# Patient Record
Sex: Female | Born: 1945 | Race: Black or African American | Hispanic: No | Smoking: Former smoker
Health system: Southern US, Community
[De-identification: ages and names within clinical notes are randomized; demographics above are authoritative.]

## PROBLEM LIST (undated history)

## (undated) DIAGNOSIS — E079 Disorder of thyroid, unspecified: Secondary | ICD-10-CM

## (undated) DIAGNOSIS — I671 Cerebral aneurysm, nonruptured: Secondary | ICD-10-CM

## (undated) DIAGNOSIS — I10 Essential (primary) hypertension: Secondary | ICD-10-CM

## (undated) HISTORY — PX: BRAIN SURGERY: SHX531

## (undated) HISTORY — PX: OTHER SURGICAL HISTORY: SHX169

---

## 1984-08-07 DIAGNOSIS — Z862 Personal history of diseases of the blood and blood-forming organs and certain disorders involving the immune mechanism: Secondary | ICD-10-CM | POA: Insufficient documentation

## 2013-07-08 DIAGNOSIS — R4189 Other symptoms and signs involving cognitive functions and awareness: Secondary | ICD-10-CM | POA: Insufficient documentation

## 2013-07-08 DIAGNOSIS — E039 Hypothyroidism, unspecified: Secondary | ICD-10-CM | POA: Insufficient documentation

## 2013-07-08 DIAGNOSIS — I10 Essential (primary) hypertension: Secondary | ICD-10-CM | POA: Insufficient documentation

## 2013-07-08 DIAGNOSIS — I69319 Unspecified symptoms and signs involving cognitive functions following cerebral infarction: Secondary | ICD-10-CM | POA: Insufficient documentation

## 2016-02-18 DIAGNOSIS — M1990 Unspecified osteoarthritis, unspecified site: Secondary | ICD-10-CM | POA: Insufficient documentation

## 2016-02-18 DIAGNOSIS — M810 Age-related osteoporosis without current pathological fracture: Secondary | ICD-10-CM | POA: Insufficient documentation

## 2016-12-06 DIAGNOSIS — E611 Iron deficiency: Secondary | ICD-10-CM | POA: Insufficient documentation

## 2017-01-12 DIAGNOSIS — F339 Major depressive disorder, recurrent, unspecified: Secondary | ICD-10-CM | POA: Insufficient documentation

## 2017-04-14 DIAGNOSIS — I422 Other hypertrophic cardiomyopathy: Secondary | ICD-10-CM | POA: Insufficient documentation

## 2018-01-18 DIAGNOSIS — H04123 Dry eye syndrome of bilateral lacrimal glands: Secondary | ICD-10-CM | POA: Insufficient documentation

## 2018-01-18 DIAGNOSIS — H2513 Age-related nuclear cataract, bilateral: Secondary | ICD-10-CM | POA: Insufficient documentation

## 2018-07-07 ENCOUNTER — Inpatient Hospital Stay
Admission: EM | Admit: 2018-07-07 | Discharge: 2018-07-10 | DRG: 516 | Disposition: A | Discharge status: Home Health | Payer: Medicare Other | Attending: Internal Medicine | Admitting: Internal Medicine

## 2018-07-07 ENCOUNTER — Encounter: Payer: Self-pay | Admitting: Emergency Medicine

## 2018-07-07 ENCOUNTER — Other Ambulatory Visit: Payer: Self-pay

## 2018-07-07 ENCOUNTER — Emergency Department: Payer: Medicare Other

## 2018-07-07 DIAGNOSIS — Z886 Allergy status to analgesic agent status: Secondary | ICD-10-CM

## 2018-07-07 DIAGNOSIS — J432 Centrilobular emphysema: Secondary | ICD-10-CM | POA: Diagnosis present

## 2018-07-07 DIAGNOSIS — R7989 Other specified abnormal findings of blood chemistry: Secondary | ICD-10-CM | POA: Diagnosis present

## 2018-07-07 DIAGNOSIS — W010XXA Fall on same level from slipping, tripping and stumbling without subsequent striking against object, initial encounter: Secondary | ICD-10-CM | POA: Diagnosis present

## 2018-07-07 DIAGNOSIS — Z7983 Long term (current) use of bisphosphonates: Secondary | ICD-10-CM | POA: Diagnosis not present

## 2018-07-07 DIAGNOSIS — S22070A Wedge compression fracture of T9-T10 vertebra, initial encounter for closed fracture: Secondary | ICD-10-CM | POA: Diagnosis present

## 2018-07-07 DIAGNOSIS — Z8619 Personal history of other infectious and parasitic diseases: Secondary | ICD-10-CM

## 2018-07-07 DIAGNOSIS — Z419 Encounter for procedure for purposes other than remedying health state, unspecified: Secondary | ICD-10-CM

## 2018-07-07 DIAGNOSIS — I248 Other forms of acute ischemic heart disease: Secondary | ICD-10-CM | POA: Diagnosis present

## 2018-07-07 DIAGNOSIS — I119 Hypertensive heart disease without heart failure: Secondary | ICD-10-CM | POA: Diagnosis present

## 2018-07-07 DIAGNOSIS — Z79899 Other long term (current) drug therapy: Secondary | ICD-10-CM

## 2018-07-07 DIAGNOSIS — Z7982 Long term (current) use of aspirin: Secondary | ICD-10-CM

## 2018-07-07 DIAGNOSIS — Z8679 Personal history of other diseases of the circulatory system: Secondary | ICD-10-CM

## 2018-07-07 DIAGNOSIS — R778 Other specified abnormalities of plasma proteins: Secondary | ICD-10-CM

## 2018-07-07 DIAGNOSIS — E079 Disorder of thyroid, unspecified: Secondary | ICD-10-CM | POA: Diagnosis present

## 2018-07-07 DIAGNOSIS — Y92008 Other place in unspecified non-institutional (private) residence as the place of occurrence of the external cause: Secondary | ICD-10-CM

## 2018-07-07 DIAGNOSIS — F17200 Nicotine dependence, unspecified, uncomplicated: Secondary | ICD-10-CM | POA: Diagnosis present

## 2018-07-07 DIAGNOSIS — J441 Chronic obstructive pulmonary disease with (acute) exacerbation: Secondary | ICD-10-CM

## 2018-07-07 DIAGNOSIS — Z79890 Hormone replacement therapy (postmenopausal): Secondary | ICD-10-CM | POA: Diagnosis not present

## 2018-07-07 DIAGNOSIS — I493 Ventricular premature depolarization: Secondary | ICD-10-CM | POA: Diagnosis present

## 2018-07-07 DIAGNOSIS — S22000A Wedge compression fracture of unspecified thoracic vertebra, initial encounter for closed fracture: Secondary | ICD-10-CM | POA: Diagnosis present

## 2018-07-07 DIAGNOSIS — W19XXXA Unspecified fall, initial encounter: Secondary | ICD-10-CM

## 2018-07-07 HISTORY — DX: Cerebral aneurysm, nonruptured: I67.1

## 2018-07-07 HISTORY — DX: Disorder of thyroid, unspecified: E07.9

## 2018-07-07 HISTORY — DX: Essential (primary) hypertension: I10

## 2018-07-07 LAB — CBC
HCT: 45.7 % (ref 36.0–46.0)
Hemoglobin: 15.3 g/dL — ABNORMAL HIGH (ref 12.0–15.0)
MCH: 33.2 pg (ref 26.0–34.0)
MCHC: 33.5 g/dL (ref 30.0–36.0)
MCV: 99.1 fL (ref 80.0–100.0)
PLATELETS: 241 10*3/uL (ref 150–400)
RBC: 4.61 MIL/uL (ref 3.87–5.11)
RDW: 13.8 % (ref 11.5–15.5)
WBC: 10.3 10*3/uL (ref 4.0–10.5)
nRBC: 0 % (ref 0.0–0.2)

## 2018-07-07 LAB — COMPREHENSIVE METABOLIC PANEL
ALT: 11 U/L (ref 0–44)
AST: 24 U/L (ref 15–41)
Albumin: 3.8 g/dL (ref 3.5–5.0)
Alkaline Phosphatase: 45 U/L (ref 38–126)
Anion gap: 14 (ref 5–15)
BUN: 12 mg/dL (ref 8–23)
CHLORIDE: 104 mmol/L (ref 98–111)
CO2: 22 mmol/L (ref 22–32)
Calcium: 9 mg/dL (ref 8.9–10.3)
Creatinine, Ser: 0.63 mg/dL (ref 0.44–1.00)
GFR calc Af Amer: >60 mL/min (ref >60)
GFR calc non Af Amer: >60 mL/min (ref >60)
Glucose, Bld: 145 mg/dL — ABNORMAL HIGH (ref 70–99)
Potassium: 3.5 mmol/L (ref 3.5–5.1)
Sodium: 140 mmol/L (ref 135–145)
Total Bilirubin: 1.1 mg/dL (ref 0.3–1.2)
Total Protein: 8.5 g/dL — ABNORMAL HIGH (ref 6.5–8.1)

## 2018-07-07 LAB — TROPONIN I
Troponin I: 0.13 ng/mL (ref <0.03)
Troponin I: 0.1 ng/mL (ref <0.03)
Troponin I: 0.1 ng/mL (ref <0.03)

## 2018-07-07 MED ORDER — METHYLPREDNISOLONE SODIUM SUCC 125 MG IJ SOLR
125.0000 mg | Freq: Once | INTRAMUSCULAR | Status: AC
  Administered 2018-07-07: 125 mg via INTRAVENOUS
  Filled 2018-07-07: qty 2

## 2018-07-07 MED ORDER — SENNA 8.6 MG PO TABS
1.0000 | ORAL_TABLET | Freq: Two times a day (BID) | ORAL | Status: DC
  Administered 2018-07-07 – 2018-07-10 (×5): 8.6 mg via ORAL
  Filled 2018-07-07 (×5): qty 1

## 2018-07-07 MED ORDER — ACETAMINOPHEN 650 MG RE SUPP: 650.0000 mg | Freq: Four times a day (QID) | RECTAL | Status: DC | PRN

## 2018-07-07 MED ORDER — POLYETHYLENE GLYCOL 3350 17 G PO PACK
17.0000 g | PACK | Freq: Every day | ORAL | Status: DC | PRN
  Administered 2018-07-10: 17 g via ORAL
  Filled 2018-07-07 (×2): qty 1

## 2018-07-07 MED ORDER — MORPHINE SULFATE (PF) 4 MG/ML IV SOLN
4.0000 mg | Freq: Once | INTRAVENOUS | Status: AC
  Administered 2018-07-07: 4 mg via INTRAVENOUS
  Filled 2018-07-07: qty 1

## 2018-07-07 MED ORDER — ONDANSETRON HCL 4 MG/2ML IJ SOLN: 4.0000 mg | Freq: Four times a day (QID) | INTRAMUSCULAR | Status: DC | PRN

## 2018-07-07 MED ORDER — OXYCODONE HCL 5 MG PO TABS
5.0000 mg | ORAL_TABLET | ORAL | Status: DC | PRN
  Administered 2018-07-07 – 2018-07-10 (×7): 5 mg via ORAL
  Filled 2018-07-07 (×7): qty 1

## 2018-07-07 MED ORDER — ONDANSETRON HCL 4 MG/2ML IJ SOLN
4.0000 mg | Freq: Once | INTRAMUSCULAR | Status: AC
  Administered 2018-07-07: 4 mg via INTRAVENOUS
  Filled 2018-07-07: qty 2

## 2018-07-07 MED ORDER — ONDANSETRON HCL 4 MG PO TABS: 4.0000 mg | ORAL_TABLET | Freq: Four times a day (QID) | ORAL | Status: DC | PRN

## 2018-07-07 MED ORDER — VITAMIN D3 25 MCG (1000 UNIT) PO TABS
1000.0000 [IU] | ORAL_TABLET | Freq: Every day | ORAL | Status: DC
  Administered 2018-07-07 – 2018-07-10 (×4): 1000 [IU] via ORAL
  Filled 2018-07-07 (×7): qty 1

## 2018-07-07 MED ORDER — ALBUTEROL SULFATE (2.5 MG/3ML) 0.083% IN NEBU: 2.5000 mg | INHALATION_SOLUTION | RESPIRATORY_TRACT | Status: DC | PRN

## 2018-07-07 MED ORDER — ORAL CARE MOUTH RINSE
15.0000 mL | Freq: Two times a day (BID) | OROMUCOSAL | Status: DC
  Administered 2018-07-07 – 2018-07-09 (×3): 15 mL via OROMUCOSAL

## 2018-07-07 MED ORDER — METOPROLOL SUCCINATE ER 50 MG PO TB24
100.0000 mg | ORAL_TABLET | Freq: Every day | ORAL | Status: DC
  Administered 2018-07-07 – 2018-07-10 (×4): 100 mg via ORAL
  Filled 2018-07-07 (×4): qty 2

## 2018-07-07 MED ORDER — ACETAMINOPHEN 325 MG PO TABS
650.0000 mg | ORAL_TABLET | Freq: Four times a day (QID) | ORAL | Status: DC | PRN
  Administered 2018-07-08 – 2018-07-10 (×3): 650 mg via ORAL
  Filled 2018-07-07 (×3): qty 2

## 2018-07-07 MED ORDER — IPRATROPIUM-ALBUTEROL 0.5-2.5 (3) MG/3ML IN SOLN
3.0000 mL | Freq: Once | RESPIRATORY_TRACT | Status: AC
  Administered 2018-07-07: 3 mL via RESPIRATORY_TRACT
  Filled 2018-07-07: qty 3

## 2018-07-07 MED ORDER — LISINOPRIL 20 MG PO TABS
40.0000 mg | ORAL_TABLET | Freq: Every day | ORAL | Status: DC
  Administered 2018-07-07 – 2018-07-10 (×4): 40 mg via ORAL
  Filled 2018-07-07 (×4): qty 2

## 2018-07-07 MED ORDER — ASPIRIN EC 81 MG PO TBEC
81.0000 mg | DELAYED_RELEASE_TABLET | Freq: Every day | ORAL | Status: DC
  Administered 2018-07-07 – 2018-07-10 (×3): 81 mg via ORAL
  Filled 2018-07-07 (×3): qty 1

## 2018-07-07 MED ORDER — LEVOTHYROXINE SODIUM 50 MCG PO TABS
75.0000 ug | ORAL_TABLET | Freq: Every day | ORAL | Status: DC
  Administered 2018-07-08 – 2018-07-10 (×3): 75 ug via ORAL
  Filled 2018-07-07 (×3): qty 1

## 2018-07-07 MED ORDER — PANTOPRAZOLE SODIUM 20 MG PO TBEC
20.0000 mg | DELAYED_RELEASE_TABLET | Freq: Every day | ORAL | Status: DC
  Administered 2018-07-07 – 2018-07-10 (×4): 20 mg via ORAL
  Filled 2018-07-07 (×4): qty 1

## 2018-07-07 NOTE — ED Notes (Signed)
ED TO INPATIENT HANDOFF REPORT  Name/Age/Gender Emma Mejia 72 y.o. female  Code Status   Home/SNF/Other Home  Chief Complaint fall,back pain  Level of Care/Admitting Diagnosis ED Disposition    ED Disposition Condition Comment   Admit  Hospital Area: [facility] [[id number]]  Level of Care: Med-Surg [16]  Diagnosis: Compression fracture of body of thoracic vertebra [facility]) [[id number]]  Admitting Physician: Joselyn Glassman  Attending Physician: Joselyn Glassman  Estimated length of stay: past midnight tomorrow  Certification:: I certify this patient will need inpatient services for at least 2 midnights  PT Class (Do Not Modify): Inpatient [101]  PT Acc Code (Do Not Modify): Private [1]       Medical History Past Medical History:  Diagnosis Date  . Brain aneurysm   . Hypertension   . Thyroid disease     Allergies Allergies  Allergen Reactions  . Ibuprofen   . Naproxen     IV Location/Drains/Wounds Patient Lines/Drains/Airways Status   Active Line/Drains/Airways    Name:   Placement date:   Placement time:   Site:   Days:   Peripheral IV 07/07/18 Left Antecubital   07/07/18    1026    Antecubital   less than 1          Labs/Imaging Results for orders placed or performed during the hospital encounter of 07/07/18 (from the past 48 hour(s))  CBC     Status: Abnormal   Collection Time: 07/07/18 10:23 AM  Result Value Ref Range   WBC 10.3 4.0 - 10.5 K/uL   RBC 4.61 3.87 - 5.11 MIL/uL   Hemoglobin 15.3 (H) 12.0 - 15.0 g/dL   HCT 45.4 09.8 - 11.9 %   MCV 99.1 80.0 - 100.0 fL   MCH 33.2 26.0 - 34.0 pg   MCHC 33.5 30.0 - 36.0 g/dL   RDW 14.7 82.9 - 56.2 %   Platelets 241 150 - 400 K/uL   nRBC 0.0 0.0 - 0.2 %    Comment: Performed at [facility], [address] [place], [place] [address]  Comprehensive metabolic panel     Status: Abnormal   Collection Time: 07/07/18 10:23 AM  Result Value Ref Range   Sodium 140 135 -  145 mmol/L   Potassium 3.5 3.5 - 5.1 mmol/L   Chloride 104 98 - 111 mmol/L   CO2 22 22 - 32 mmol/L   Glucose, Bld 145 (H) 70 - 99 mg/dL   BUN 12 8 - 23 mg/dL   Creatinine, Ser 5.78 0.44 - 1.00 mg/dL   Calcium 9.0 8.9 - 46.9 mg/dL   Total Protein 8.5 (H) 6.5 - 8.1 g/dL   Albumin 3.8 3.5 - 5.0 g/dL   AST 24 15 - 41 U/L   ALT 11 0 - 44 U/L   Alkaline Phosphatase 45 38 - 126 U/L   Total Bilirubin 1.1 0.3 - 1.2 mg/dL   GFR calc non Af Amer >60 >60 mL/min   GFR calc Af Amer >60 >60 mL/min   Anion gap 14 5 - 15    Comment: Performed at [facility], [address] [place], [place] [address]  Troponin I - ONCE - STAT     Status: Abnormal   Collection Time: 07/07/18 10:23 AM  Result Value Ref Range   Troponin I 0.13 (HH) <0.03 ng/mL    Comment: CRITICAL RESULT CALLED TO, READ BACK BY AND VERIFIED WITH Kamar Callender ON 07/07/18 AT 1115 QSD Performed at  [facility], [address] [place], [place] [address]    Ct Chest Wo Contrast  Result Date: 07/07/2018 CLINICAL DATA:  Fall from porch this morning, hypoxia, back pain EXAM: CT CHEST WITHOUT CONTRAST TECHNIQUE: Multidetector CT imaging of the chest was performed following the standard protocol without IV contrast. COMPARISON:  None. FINDINGS: Cardiovascular: Mild cardiomegaly. No significant pericardial fluid/thickening. Three-vessel coronary atherosclerosis. Atherosclerotic nonaneurysmal thoracic aorta. No acute intramural hematoma in the thoracic aorta. Dilated main pulmonary artery (3.5 cm diameter). Mediastinum/Nodes: No pneumomediastinum. No mediastinal hematoma. No discrete thyroid nodules. Unremarkable esophagus. No axillary adenopathy. Mildly enlarged 1.0 cm right paratracheal node (series 2/image 53). No discrete hilar adenopathy on this noncontrast scan. Lungs/Pleura: No pneumothorax. No pleural effusion. Severe centrilobular emphysema with mild diffuse bronchial wall thickening. Patchy reticulation in the  dependent lung bases bilaterally, which could represent hypoventilatory change or nonspecific scarring. No acute consolidative airspace disease, lung masses or significant pulmonary nodules. Upper abdomen: Cholelithiasis. 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 Musculoskeletal: No aggressive appearing focal osseous lesions. Late subacute healing nondisplaced lateral left fourth through sixth rib fractures. Moderate to severe T10 vertebral compression fracture of uncertain chronicity, with 6 mm bony retropulsion narrowing the anterior thoracic canal. Moderate thoracic spondylosis. IMPRESSION: 1. Moderate to severe T10 vertebral compression fracture of uncertain chronicity, potentially acute, with 6 mm bony retropulsion narrowing the anterior thoracic canal. Thoracic spine MRI could be obtained if there is clinical concern for cord compression. 2. Severe centrilobular emphysema with mild diffuse bronchial wall thickening, suggesting COPD. Patchy bibasilar reticulation, which could represent atelectasis or nonspecific scarring. 3. Cardiomegaly.  Three-vessel coronary atherosclerosis. 4. Dilated main pulmonary artery, suggesting pulmonary arterial hypertension. 5. Nonspecific mild right paratracheal adenopathy. No follow-up required. This recommendation follows ACR consensus guidelines: Managing Incidental Findings on Thoracic CT: Mediastinal and Cardiovascular Findings. A White Paper of the ACR Incidental Findings Committee. J Am Coll Radiol. 2018; 15: 2956-2130. 6. Cholelithiasis. Aortic Atherosclerosis (ICD10-I70.0) and Emphysema (ICD10-J43.9). Electronically Signed   By: Delbert Phenix M.D.   On: 07/07/2018 11:48   Ct Lumbar Spine Wo Contrast  Result Date: 07/07/2018 CLINICAL DATA:  Back pain after a fall today.  Initial encounter. EXAM: CT LUMBAR SPINE WITHOUT CONTRAST TECHNIQUE: Multidetector CT imaging of the lumbar spine was performed without intravenous contrast administration. Multiplanar CT  image reconstructions were also generated. COMPARISON:  None. FINDINGS: Segmentation: Transitional lumbosacral anatomy with partial lumbarization of S1. Alignment: Normal. Vertebrae: L4 and L5 compression fractures with mild vertebral body height loss, no retropulsion, no discretely visible fracture line, and no significant paravertebral edema. No suspicious osseous lesion. Paraspinal and other soft tissues: Extensive aortic atherosclerosis without aneurysm. Gallstones. Status post splenectomy. Disc levels: Preserved disc space heights throughout the lumbar spine. Disc bulging results in mild to moderate multilevel neural foraminal stenosis, greatest at L5-S1. No evidence of significant spinal stenosis. Mild multilevel facet hypertrophy. IMPRESSION: 1. Mild L4 and L5 compression fractures, technically age indeterminate though favored to be chronic. Consider lumbar spine MRI if there is clinical concern for an acute fracture. 2. Mild lumbar spondylosis with mild-to-moderate neural foraminal stenosis. No spinal stenosis. 3.  Aortic Atherosclerosis (ICD10-I70.0). Electronically Signed   By: Sebastian Ache M.D.   On: 07/07/2018 11:12    Pending Labs [employer] (From admission, onward)    Start     Ordered   Signed and Held  CBC  (enoxaparin (LOVENOX)    CrCl >/= 30 ml/min)  Once,   R    Comments:  Baseline  for enoxaparin therapy IF NOT ALREADY DRAWN.  Notify MD if PLT < 100 K.    Signed and Held   Signed and Held  Creatinine, serum  (enoxaparin (LOVENOX)    CrCl >/= 30 ml/min)  Once,   R    Comments:  Baseline for enoxaparin therapy IF NOT ALREADY DRAWN.    Signed and Held   Signed and Held  Creatinine, serum  (enoxaparin (LOVENOX)    CrCl >/= 30 ml/min)  Weekly,   R    Comments:  while on enoxaparin therapy    Signed and Held   Signed and Held  Troponin I - Now Then Q6H  Now then every 6 hours,   R     Signed and Held          Vitals/Pain Today's Vitals   07/07/18 1102 07/07/18 1200  07/07/18 1230 07/07/18 1300  BP:  (!) 141/82 (!) 163/77 (!) 158/70  Pulse:  83 86 90  Resp:  15 20 15   SpO2:  95% 94% 97%  Weight:      Height:      PainSc: 0-No pain       Isolation Precautions No active isolations  Medications Medications  morphine 4 MG/ML injection 4 mg (4 mg Intravenous Given 07/07/18 1030)  ondansetron (ZOFRAN) injection 4 mg (4 mg Intravenous Given 07/07/18 1030)  methylPREDNISolone sodium succinate (SOLU-MEDROL) 125 mg/2 mL injection 125 mg (125 mg Intravenous Given 07/07/18 1242)  ipratropium-albuterol (DUONEB) 0.5-2.5 (3) MG/3ML nebulizer solution 3 mL (3 mLs Nebulization Given 07/07/18 1243)  ipratropium-albuterol (DUONEB) 0.5-2.5 (3) MG/3ML nebulizer solution 3 mL (3 mLs Nebulization Given 07/07/18 1242)    Mobility walks with person assist

## 2018-07-07 NOTE — ED Notes (Signed)
Patient transported to CT 

## 2018-07-07 NOTE — ED Provider Notes (Signed)
[facility] Emergency Department Provider Note  Time seen: 10:25 AM  I have reviewed the triage vital signs and the nursing notes.   HISTORY  Chief Complaint Fall    HPI Emma Mejia is a 72 y.o. female with a past medical history of hypertension, past brain aneurysm, presents to the emergency department for back pain after a fall.  According to the patient around 7:00 this morning she slipped on her front porch falling on her buttocks and back.  Patient states initially she was able to get up and walk however over the last 2 to 3 hours the pain has worsened substantially and is now experiencing significant pain mostly in her mid to upper back.  Denies any chest pain.  Denies any trouble breathing.  Largely negative review of systems otherwise.   Past Medical History:  Diagnosis Date  . Brain aneurysm   . Hypertension   . Thyroid disease     There are no active problems to display for this patient.   Past Surgical History:  Procedure Laterality Date  . BRAIN SURGERY    . spleen      Prior to Admission medications   Not on File    Allergies  Allergen Reactions  . Ibuprofen   . Naproxen     No family history on file.  Social History Social History   Tobacco Use  . Smoking status: Current Every Day Smoker  . Smokeless tobacco: Never Used  Substance Use Topics  . Alcohol use: Not Currently  . Drug use: Not Currently    Review of Systems Constitutional: Negative for fever Cardiovascular: Negative for chest pain. Respiratory: Negative for shortness of breath. Gastrointestinal: Negative for abdominal pain Musculoskeletal: Patient with mid to upper back pain. Skin: Negative for skin complaints  Neurological: Negative for headache All other ROS negative  ____________________________________________   PHYSICAL EXAM:  VITAL SIGNS: ED Triage Vitals  Enc Vitals Group     BP 07/07/18 1000 (!) 193/102     Pulse Rate 07/07/18 1000  (!) 113     Resp 07/07/18 1000 18     Temp --      Temp src --      SpO2 07/07/18 1000 (!) 88 %     Weight 07/07/18 1002 120 lb (54.4 kg)     Height 07/07/18 1002 5\' 3"  (1.6 m)     Head Circumference --      Peak Flow --      Pain Score --      Pain Loc --      Pain Edu? --      Excl. in GC? --    Constitutional: Alert and oriented. Well appearing and in no distress. Eyes: Normal exam ENT   Head: Normocephalic and atraumatic.   Mouth/Throat: Mucous membranes are moist. Cardiovascular: Normal rate, regular rhythm. No murmur Respiratory: Normal respiratory effort without tachypnea nor retractions. Breath sounds are clear  Gastrointestinal: Soft and nontender. No distention.  Musculoskeletal: Patient has mild to moderate left-sided paraspinal tenderness to palpation in the mid thoracic spine.  No lumbar midline tenderness to palpation. Neurologic:  Normal speech and language. No gross focal neurologic deficits  Skin:  Skin is warm, dry and intact.  Psychiatric: Mood and affect are normal.  ____________________________________________    EKG  EKG reviewed and interpreted by myself shows a normal sinus rhythm 88 bpm with a narrow QRS, normal axis, largely normal intervals.  Patient does have lateral T wave inversions  with occasional PVC.  No old EKG for comparison.  ____________________________________________    RADIOLOGY  CT scan shows a moderate to severe T10 compression fracture there is 6 mm of retropulsion.  Patient also appears to have fairly severe COPD.  CT lumbar spine shows possible L4-L5 compression fractions although favored to be chronic.  ____________________________________________   INITIAL IMPRESSION / ASSESSMENT AND PLAN / ED COURSE  Pertinent labs & imaging results that were available during my care of the patient were reviewed by me and considered in my medical decision making (see chart for details).  Patient presents to the emergency department  with midthoracic back pain since a fall this morning.  Patient has been ambulatory since the fall.  States the pain has progressively worsened over the past 2 to 3 hours.  Upon arrival patient is noted to be mildly hypoxic 88% on room air placed on 2 L nasal cannula and is satting in the low to mid 90s.  Denies any home O2 use.  Patient has moderate mid thoracic tenderness in the paraspinal region no midline tenderness.  We will obtain CT imaging of the chest and lumbar spine to further evaluate.  We will treat pain, check labs and an EKG.  Patient CT show a T10 compression fracture with 6 mm of retropulsion.  Normal neurological examination.  Patient remains hypoxic in the upper 80s on room air with no home O2 requirement.  CT scan of the chest appears most consistent with COPD patient has never been diagnosed with COPD but is a daily smoker and has been smoking for many many years per patient.  We will treat with DuoNeb's and Solu-Medrol.  Patient's troponin is elevated 0.13 with no history in our system of elevated troponins in the past.  Normal kidney function.  We will cover with aspirin, patient will require ongoing monitoring.  Given the patient's back pain and compression fracture we will admit to the hospitalist service for ongoing treatment of her pain as well as COPD.  Patient agreeable to plan of care.  ____________________________________________   FINAL CLINICAL IMPRESSION(S) / ED DIAGNOSES  Back pain Compression fracture COPD Elevated troponin   Minna AntisPaduchowski, Jerrika Ledlow, MD 07/07/18 1233

## 2018-07-07 NOTE — ED Notes (Signed)
Pt ambulatory to toilet with 1 assist.  

## 2018-07-07 NOTE — ED Notes (Signed)
Attempted to call report, on hold for greater than 10 mins.  Called back and told charge RN has been busy and has not looked at pt.  Per Charge RN will take pt to floor and give bedside report.

## 2018-07-07 NOTE — ED Notes (Signed)
Date and time results received: 07/07/18 11:15 AM    Test: Troponin Critical Value: 0.13  Name of Provider Notified: Paduchowski

## 2018-07-07 NOTE — ED Triage Notes (Signed)
Pt to ED via POV with son who states that pt fell this morning on the porch. Upon triage pt O2 sat in the 80's. Pt brought to room 3 and placed on O2. Pt c/o pain in her back.

## 2018-07-07 NOTE — Plan of Care (Signed)

## 2018-07-07 NOTE — H&P (Addendum)
[facility] - [place] at [facility]   PATIENT NAME: Emma Mejia    MR#:  [id number]  DATE OF BIRTH:  1946-06-14  DATE OF ADMISSION:  07/07/2018  PRIMARY CARE PHYSICIAN: Bertram Savin, MD   REQUESTING/REFERRING PHYSICIAN: dr Thomasene Mohair  CHIEF COMPLAINT:   Patient reports falling on the porch this morning landed on her buttocks. Complaining of back pain. HISTORY OF PRESENT ILLNESS:  Emma Mejia  is a 72 y.o. female with a known history of ongoing tobacco abuse, hypertension comes to the emergency room after she landed on her buttock while being on the porch this morning. Patient said she was awake alert she lost her balance started having back pain came to the emergency room was found to have T10 compression fracture.  Patient has history of smoking. She was found to be hypoxic sats were 8788 on room air. She is currently not having shortness of breath or chest pain. She does not have any cardiac history with CAD or MI in the past according to the daughter.  Troponin was .13. When patient came to the emergency room her blood pressure was 198/100.  Patient is being admitted for further evaluation of management. Daughter and son in the room.  PAST MEDICAL HISTORY:   Past Medical History:  Diagnosis Date  . Brain aneurysm   . Hypertension   . Thyroid disease     PAST SURGICAL HISTOIRY:   Past Surgical History:  Procedure Laterality Date  . BRAIN SURGERY    . spleen      SOCIAL HISTORY:   Social History   Tobacco Use  . Smoking status: Current Every Day Smoker  . Smokeless tobacco: Never Used  Substance Use Topics  . Alcohol use: Not Currently    FAMILY HISTORY:  No family history on file.  DRUG ALLERGIES:   Allergies  Allergen Reactions  . Ibuprofen   . Naproxen     REVIEW OF SYSTEMS:  Review of Systems  Constitutional: Negative for chills, fever and weight loss.  HENT: Negative for ear discharge, ear pain and  nosebleeds.   Eyes: Negative for blurred vision, pain and discharge.  Respiratory: Negative for sputum production, shortness of breath, wheezing and stridor.   Cardiovascular: Negative for chest pain, palpitations, orthopnea and PND.  Gastrointestinal: Negative for abdominal pain, diarrhea, nausea and vomiting.  Genitourinary: Negative for frequency and urgency.  Musculoskeletal: Positive for back pain. Negative for joint pain.  Neurological: Negative for sensory change, speech change, focal weakness and weakness.  Psychiatric/Behavioral: Negative for depression and hallucinations. The patient is not nervous/anxious.      MEDICATIONS AT HOME:   Prior to Admission medications   Medication Sig Start Date End Date Taking? Authorizing Provider  alendronate (FOSAMAX) 70 MG tablet Take 70 mg by mouth once a week. Take with a full glass of water on an empty stomach.   Yes [provider]  aspirin EC 81 MG tablet Take 81 mg by mouth daily.   Yes [provider]  cholecalciferol (VITAMIN D) 25 MCG (1000 UT) tablet Take 1,000 Units by mouth daily.   Yes [provider]  levothyroxine (SYNTHROID, LEVOTHROID) 75 MCG tablet Take 75 mcg by mouth daily before breakfast.   Yes [provider]  lisinopril (PRINIVIL,ZESTRIL) 20 MG tablet Take 40 mg by mouth daily.   Yes [provider]  metoprolol succinate (TOPROL-XL) 100 MG 24 hr tablet Take 100 mg by mouth daily.   Yes [provider]  pantoprazole (PROTONIX) 20 MG tablet Take 20 mg by mouth daily.   Yes [provider]      VITAL SIGNS:  Blood pressure (!) 141/82, pulse 83, resp. rate 15, height 5\' 3"  (1.6 m), weight 54.4 kg, SpO2 95 %.  PHYSICAL EXAMINATION:  GENERAL:  72 y.o.-year-old patient lying in the bed with no acute distress.thin fraile  EYES: Pupils equal, round, reactive to light and accommodation. No scleral icterus. Extraocular muscles intact.  HEENT: Head atraumatic,  normocephalic. Oropharynx and nasopharynx clear.  NECK:  Supple, no jugular venous distention. No thyroid enlargement, no tenderness.  LUNGS: distant breath sounds bilaterally, no wheezing, rales,rhonchi or crepitation. No use of accessory muscles of respiration.  CARDIOVASCULAR: S1, S2 normal. No murmurs, rubs, or gallops.  ABDOMEN: Soft, nontender, nondistended. Bowel sounds present. No organomegaly or mass.  EXTREMITIES: No pedal edema, cyanosis, or clubbing.  NEUROLOGIC: Cranial nerves II through XII are intact. Muscle strength 5/5 in all extremities. Sensation intact. Gait not checked.  PSYCHIATRIC: The patient is alert and oriented x 3.  SKIN: No obvious rash, lesion, or ulcer.   LABORATORY PANEL:   CBC Recent Labs  Lab 07/07/18 1023  WBC 10.3  HGB 15.3*  HCT 45.7  PLT 241   ------------------------------------------------------------------------------------------------------------------  Chemistries  Recent Labs  Lab 07/07/18 1023  NA 140  K 3.5  CL 104  CO2 22  GLUCOSE 145*  BUN 12  CREATININE 0.63  CALCIUM 9.0  AST 24  ALT 11  ALKPHOS 45  BILITOT 1.1   ------------------------------------------------------------------------------------------------------------------  Cardiac Enzymes Recent Labs  Lab 07/07/18 1023  TROPONINI 0.13*   ------------------------------------------------------------------------------------------------------------------  RADIOLOGY:  Ct Chest Wo Contrast  Result Date: 07/07/2018 CLINICAL DATA:  Fall from porch this morning, hypoxia, back pain EXAM: CT CHEST WITHOUT CONTRAST TECHNIQUE: Multidetector CT imaging of the chest was performed following the standard protocol without IV contrast. COMPARISON:  None. FINDINGS: Cardiovascular: Mild cardiomegaly. No significant pericardial fluid/thickening. Three-vessel coronary atherosclerosis. Atherosclerotic nonaneurysmal thoracic aorta. No acute intramural hematoma in the thoracic aorta.  Dilated main pulmonary artery (3.5 cm diameter). Mediastinum/Nodes: No pneumomediastinum. No mediastinal hematoma. No discrete thyroid nodules. Unremarkable esophagus. No axillary adenopathy. Mildly enlarged 1.0 cm right paratracheal node (series 2/image 53). No discrete hilar adenopathy on this noncontrast scan. Lungs/Pleura: No pneumothorax. No pleural effusion. Severe centrilobular emphysema with mild diffuse bronchial wall thickening. Patchy reticulation in the dependent lung bases bilaterally, which could represent hypoventilatory change or nonspecific scarring. No acute consolidative airspace disease, lung masses or significant pulmonary nodules. Upper abdomen: Cholelithiasis. 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 Musculoskeletal: No aggressive appearing focal osseous lesions. Late subacute healing nondisplaced lateral left fourth through sixth rib fractures. Moderate to severe T10 vertebral compression fracture of uncertain chronicity, with 6 mm bony retropulsion narrowing the anterior thoracic canal. Moderate thoracic spondylosis. IMPRESSION: 1. Moderate to severe T10 vertebral compression fracture of uncertain chronicity, potentially acute, with 6 mm bony retropulsion narrowing the anterior thoracic canal. Thoracic spine MRI could be obtained if there is clinical concern for cord compression. 2. Severe centrilobular emphysema with mild diffuse bronchial wall thickening, suggesting COPD. Patchy bibasilar reticulation, which could represent atelectasis or nonspecific scarring. 3. Cardiomegaly.  Three-vessel coronary atherosclerosis. 4. Dilated main pulmonary artery, suggesting pulmonary arterial hypertension. 5. Nonspecific mild right paratracheal adenopathy. No follow-up required. This recommendation follows ACR consensus guidelines: Managing Incidental Findings on Thoracic CT: Mediastinal and Cardiovascular Findings. A White Paper of the ACR Incidental Findings Committee. J Am Coll Radiol.  2018; 15: 8676-7209: 1087-1096. 6.  Cholelithiasis. Aortic Atherosclerosis (ICD10-I70.0) and Emphysema (ICD10-J43.9). Electronically Signed   By: Delbert Phenix M.D.   On: 07/07/2018 11:48   Ct Lumbar Spine Wo Contrast  Result Date: 07/07/2018 CLINICAL DATA:  Back pain after a fall today.  Initial encounter. EXAM: CT LUMBAR SPINE WITHOUT CONTRAST TECHNIQUE: Multidetector CT imaging of the lumbar spine was performed without intravenous contrast administration. Multiplanar CT image reconstructions were also generated. COMPARISON:  None. FINDINGS: Segmentation: Transitional lumbosacral anatomy with partial lumbarization of S1. Alignment: Normal. Vertebrae: L4 and L5 compression fractures with mild vertebral body height loss, no retropulsion, no discretely visible fracture line, and no significant paravertebral edema. No suspicious osseous lesion. Paraspinal and other soft tissues: Extensive aortic atherosclerosis without aneurysm. Gallstones. Status post splenectomy. Disc levels: Preserved disc space heights throughout the lumbar spine. Disc bulging results in mild to moderate multilevel neural foraminal stenosis, greatest at L5-S1. No evidence of significant spinal stenosis. Mild multilevel facet hypertrophy. IMPRESSION: 1. Mild L4 and L5 compression fractures, technically age indeterminate though favored to be chronic. Consider lumbar spine MRI if there is clinical concern for an acute fracture. 2. Mild lumbar spondylosis with mild-to-moderate neural foraminal stenosis. No spinal stenosis. 3.  Aortic Atherosclerosis (ICD10-I70.0). Electronically Signed   By: Sebastian Ache M.D.   On: 07/07/2018 11:12    EKG:  sinus rhythm. Multiple PVC, LVH  IMPRESSION AND PLAN:   Emma Mejia  is a 72 y.o. female with a known history of ongoing tobacco abuse, hypertension comes to the emergency room after she landed on her buttock while being on the porch this morning. Patient said she was awake alert she lost her balance started  having back pain came to the emergency room was found to have T10 compression fracture.  1. T10 compression fracture status post fall mechanical this morning at home -admit to orthopedic floor -Dr. Rosita Kea to see patient tomorrow--- secure chat message sent - PRN pain meds -physical therapy to see  2. hypoxia suspected due to underlying emphysema/COPD with ongoing heavy tobacco abuse -PRN nebulizer -wean oxygen to office if able to -CT chest reviewed  3. Elevated troponin appears demand ischemia in the setting of elevated blood pressure and history of LVH with uncontrolled hypertension as outpatient -echo of the heart -cycle troponin's -if trending up consider cardiology consultation -continue aspirin -no history of coronary artery disease  4. Uncontrolled hypertension precipitated by pain -adjust blood pressure meds. -The RN hydralazine IV  5. DVT prophylaxis SCD Holding lovenox incase pt goes for kyphoplasty  Case management for discharge planning     All the records are reviewed and case discussed with ED provider. Management plans discussed with the patient, family and they are in agreement.  CODE STATUS: full  TOTAL TIME TAKING CARE OF THIS PATIENT: .    Enedina Finner M.D on 07/07/2018 at 12:48 PM  Between 7am to 6pm - Pager - [phone]  After 6pm go to [email or web address] - password [email or web address] [facility]  [facility]  Office  [phone]  CC: Primary care physician; Bertram Savin, MD

## 2018-07-07 NOTE — Progress Notes (Signed)
Family Meeting Note  Advance Directive:yes   Today a meeting took place with the son and dter in ER patient is being admitted with D10 compression fracture. She has uncontrolled hypertension no CAD. Elevated borderline troponin. Ongoing tobacco abuse with hypoxia and COPD. Code status discussed with patient's family son and daughter are healthcare power of attorney they want patient to be full code. Time spent during discussion: 16 mins Emma FinnerSona Romell Wolden, MD

## 2018-07-07 NOTE — Progress Notes (Signed)
Patient resting in bed. Daughter at bedside earlier. Bed lowest position, bed alarm on, call bell in reach. Patient given pain medicine, states relief. Comfortable in bed and no needs at this time by patient. Continue to monitor.

## 2018-07-07 NOTE — ED Notes (Signed)
Attempted to call report.  Secretary states charge RN is taking pt and will call back.

## 2018-07-08 ENCOUNTER — Inpatient Hospital Stay: Payer: Medicare Other

## 2018-07-08 LAB — TROPONIN I: Troponin I: 0.09 ng/mL (ref <0.03)

## 2018-07-08 MED ORDER — GUAIFENESIN-DM 100-10 MG/5ML PO SYRP
5.0000 mL | ORAL_SOLUTION | ORAL | Status: DC | PRN
  Administered 2018-07-08 – 2018-07-09 (×3): 5 mL via ORAL
  Filled 2018-07-08 (×3): qty 5

## 2018-07-08 MED ORDER — CEFAZOLIN SODIUM-DEXTROSE 1-4 GM/50ML-% IV SOLN
1.0000 g | Freq: Once | INTRAVENOUS | Status: AC
  Administered 2018-07-09: 1 g via INTRAVENOUS
  Filled 2018-07-08: qty 50

## 2018-07-08 NOTE — Anesthesia Preprocedure Evaluation (Addendum)
Anesthesia Evaluation  Patient identified by MRN, date of birth, ID band Patient awake    Reviewed: Allergy & Precautions, NPO status , Patient's Chart, lab work & pertinent test results  History of Anesthesia Complications Negative for: history of anesthetic complications  Airway Mallampati: II  TM Distance: >3 FB Neck ROM: Full    Dental  (+) Edentulous Upper, Edentulous Lower   Pulmonary neg sleep apnea, neg COPD, Current Smoker,    breath sounds clear to auscultation- rhonchi (-) wheezing      Cardiovascular hypertension, Pt. on medications (-) CAD, (-) Past MI, (-) Cardiac Stents and (-) CABG  Rhythm:Regular Rate:Normal - Systolic murmurs and - Diastolic murmurs    Neuro/Psych negative neurological ROS  negative psych ROS   GI/Hepatic negative GI ROS, Neg liver ROS,   Endo/Other  negative endocrine ROSneg diabetes  Renal/GU negative Renal ROS     Musculoskeletal negative musculoskeletal ROS (+)   Abdominal (+) - obese,   Peds  Hematology negative hematology ROS (+)   Anesthesia Other Findings Past Medical History: No date: Brain aneurysm No date: Hypertension No date: Thyroid disease   Reproductive/Obstetrics                            Anesthesia Physical Anesthesia Plan  ASA: II  Anesthesia Plan: General   Post-op Pain Management:    Induction: Intravenous  PONV Risk Score and Plan: 1 and Propofol infusion  Airway Management Planned: Natural Airway  Additional Equipment:   Intra-op Plan:   Post-operative Plan:   Informed Consent: I have reviewed the patients History and Physical, chart, labs and discussed the procedure including the risks, benefits and alternatives for the proposed anesthesia with the patient or authorized representative who has indicated his/her understanding and acceptance.   Dental advisory given  Plan Discussed with: CRNA and  Anesthesiologist  Anesthesia Plan Comments: (We will want cardiac clearance for this patient 2/2 their elevated troponin's )       Anesthesia Quick Evaluation

## 2018-07-08 NOTE — Consult Note (Signed)
Patient seen, thoracic mri ordered

## 2018-07-08 NOTE — Progress Notes (Addendum)
[facility] - [place] at [facility]   PATIENT NAME: Emma Mejia    MR#:  [id number]  DATE OF BIRTH:  09/06/1945  SUBJECTIVE:  CHIEF COMPLAINT:   Chief Complaint  Patient presents with  . Fall  back pain REVIEW OF SYSTEMS:  Review of Systems  Constitutional: Negative for chills, fever and weight loss.  HENT: Negative for nosebleeds and sore throat.   Eyes: Negative for blurred vision.  Respiratory: Negative for cough, shortness of breath and wheezing.   Cardiovascular: Negative for chest pain, orthopnea, leg swelling and PND.  Gastrointestinal: Negative for abdominal pain, constipation, diarrhea, heartburn, nausea and vomiting.  Genitourinary: Negative for dysuria and urgency.  Musculoskeletal: Positive for back pain.  Skin: Negative for rash.  Neurological: Negative for dizziness, speech change, focal weakness and headaches.  Endo/Heme/Allergies: Does not bruise/bleed easily.  Psychiatric/Behavioral: Negative for depression.    DRUG ALLERGIES:   Allergies  Allergen Reactions  . Ibuprofen   . Naproxen    VITALS:  Blood pressure (!) 162/73, pulse 70, temperature (!) 97.5 F (36.4 C), temperature source Oral, resp. rate 18, height 5\' 3"  (1.6 m), weight 54.4 kg, SpO2 91 %. PHYSICAL EXAMINATION:  Physical Exam  Constitutional: She is oriented to person, place, and time.  HENT:  Head: Normocephalic and atraumatic.  Eyes: Pupils are equal, round, and reactive to light. Conjunctivae and EOM are normal.  Neck: Normal range of motion. Neck supple. No tracheal deviation present. No thyromegaly present.  Cardiovascular: Normal rate, regular rhythm and normal heart sounds.  Pulmonary/Chest: Effort normal and breath sounds normal. No respiratory distress. She has no wheezes. She exhibits no tenderness.  Abdominal: Soft. Bowel sounds are normal. She exhibits no distension. There is no tenderness.  Musculoskeletal:       Lumbar back: She exhibits decreased  range of motion and tenderness.  Neurological: She is alert and oriented to person, place, and time. No cranial nerve deficit.  Skin: Skin is warm and dry. No rash noted.   LABORATORY PANEL:  Female CBC Recent Labs  Lab 07/07/18 1023  WBC 10.3  HGB 15.3*  HCT 45.7  PLT 241   ------------------------------------------------------------------------------------------------------------------ Chemistries  Recent Labs  Lab 07/07/18 1023  NA 140  K 3.5  CL 104  CO2 22  GLUCOSE 145*  BUN 12  CREATININE 0.63  CALCIUM 9.0  AST 24  ALT 11  ALKPHOS 45  BILITOT 1.1   RADIOLOGY:  Mr Thoracic Spine Wo Contrast  Result Date: 07/08/2018 CLINICAL DATA:  Fall yesterday. Thoracic spine fracture on CT. Initial encounter. EXAM: MRI THORACIC SPINE WITHOUT CONTRAST TECHNIQUE: Multiplanar, multisequence MR imaging of the thoracic spine was performed. No intravenous contrast was administered. COMPARISON:  Chest CT 07/07/2018 FINDINGS: The study is mildly motion degraded. Alignment: Mild thoracic dextroscoliosis No listhesis. Vertebrae: T10 compression fracture with 50% vertebral body height loss, unchanged from yesterday's CT. Extensive marrow edema along the inferior endplate with unchanged 6 mm vertebral body retropulsion. Mild diffuse bone marrow heterogeneity without suspicious focal osseous lesion. Cord: Suggestion of mild T2 hyperintensity focally in the cord at T10 on both axial spin echo and gradient echo sequences though not readily apparent on sagittal sequences. Paraspinal and other soft tissues: Mild dependent atelectasis in both lungs. Disc levels: T10 retropulsion results in moderate spinal stenosis without cord compression. There is mild disc bulging and mild-to-moderate facet arthrosis at other levels in the thoracic spine resulting in up to mild neural foraminal stenosis without significant spinal stenosis. IMPRESSION: Acute  T10 vertebral compression fracture with retropulsion resulting in  moderate spinal stenosis. Possible minimal cord edema versus artifact at this level without cord compression. Electronically Signed   By: Sebastian Ache M.D.   On: 07/08/2018 12:24   ASSESSMENT AND PLAN:  Emma Mejia  is a 72 y.o. female with a known history of ongoing tobacco abuse, hypertension admitted s/p fall and T10 compression fracture.  1. T10 compression fracture status post fall mechanical this morning at home -Dr. Rosita Kea planning Kyphoplasty tomorrow - PRN pain meds  2. hypoxia suspected due to underlying emphysema/COPD with ongoing heavy tobacco abuse -PRN nebulizer -wean oxygen to office if able to -CT chest shows Severe centrilobular emphysema   3. Elevated troponin appears demand ischemia in the setting of elevated blood pressure and history of LVH with uncontrolled hypertension as outpatient -continue aspirin -no history of coronary artery disease  4. Uncontrolled hypertension precipitated by pain -adjust blood pressure meds as need. -PRN hydralazine IV  5. History of hepatitis C cured 2015 per GI notes - f/by Dr Mardee Postin at [facility]   All the records are reviewed and case discussed with Care Management/Social Worker. Management plans discussed with the patient, nursing and they are in agreement.  CODE STATUS: Full Code  TOTAL TIME TAKING CARE OF THIS PATIENT: 35 minutes.   More than 50% of the time was spent in counseling/coordination of care: YES  POSSIBLE D/C IN 1-2 DAYS, DEPENDING ON CLINICAL CONDITION.   Delfino Lovett M.D on 07/08/2018 at 5:58 PM  Between 7am to 6pm - Pager - [phone]  After 6pm go to [email or web address] - Social research officer, government  [facility] [place] [facility]  Office  [phone]  CC: Primary care physician; Bertram Savin, MD  Note: This dictation was prepared with Dragon dictation along with smaller phrase technology. Any transcriptional errors that result from this process are unintentional.

## 2018-07-08 NOTE — Progress Notes (Signed)
PT Cancellation Note  Patient Details Name: Emma OpitzWilma Mejia MRN: [id number] DOB: 1946-07-13   Cancelled Treatment:    Reason Eval/Treat Not Completed: Other (comment).  PT consult received.  Chart reviewed.  Pt pending thoracic MRI.  Will hold PT at this time until imaging complete and will monitor for any updates to POC.  Hendricks LimesEmily Jamarkus Lisbon, PT 07/08/18, 8:45 AM [phone]

## 2018-07-08 NOTE — Progress Notes (Signed)
PT Cancellation Note  Patient Details Name: Emma OpitzWilma Mejia MRN: [id number] DOB: 07/03/1946   Cancelled Treatment:    Reason Eval/Treat Not Completed: Other (comment).  Per discussion with MD Rosita KeaMenz, plan to hold PT until after planned surgery tomorrow.  Hendricks LimesEmily Hally Colella, PT 07/08/18, 4:25 PM [phone]

## 2018-07-08 NOTE — Consult Note (Signed)
Patient is seen for evaluation of T10 compression fracture.  This morning and it shows a acute T10 compression fracture with significant retropulsion.  There is a significant edema as well.  This is in the area of the patient's pain.  She has no clonus on neurologic exam and no neuro deficit.  She is tender at T10 with some kyphotic deformity present pain radiates to the right side at that level.  Impression is severe pain secondary to T10 compression fracture  Plan: T10 kyphoplasty tomorrow

## 2018-07-09 ENCOUNTER — Inpatient Hospital Stay: Payer: Medicare Other | Admitting: Anesthesiology

## 2018-07-09 ENCOUNTER — Encounter
Admission: EM | Disposition: A | Discharge status: Home Health | Payer: Self-pay | Source: Home / Self Care | Attending: Internal Medicine

## 2018-07-09 ENCOUNTER — Inpatient Hospital Stay: Payer: Medicare Other

## 2018-07-09 HISTORY — PX: KYPHOPLASTY: SHX5884

## 2018-07-09 LAB — BASIC METABOLIC PANEL
Anion gap: 9 (ref 5–15)
BUN: 17 mg/dL (ref 8–23)
CALCIUM: 8.7 mg/dL — AB (ref 8.9–10.3)
CO2: 25 mmol/L (ref 22–32)
Chloride: 104 mmol/L (ref 98–111)
Creatinine, Ser: 0.73 mg/dL (ref 0.44–1.00)
GFR calc Af Amer: >60 mL/min (ref >60)
GFR calc non Af Amer: >60 mL/min (ref >60)
Glucose, Bld: 93 mg/dL (ref 70–99)
Potassium: 3.6 mmol/L (ref 3.5–5.1)
Sodium: 138 mmol/L (ref 135–145)

## 2018-07-09 LAB — CBC
HCT: 42.7 % (ref 36.0–46.0)
Hemoglobin: 14.3 g/dL (ref 12.0–15.0)
MCH: 33.3 pg (ref 26.0–34.0)
MCHC: 33.5 g/dL (ref 30.0–36.0)
MCV: 99.3 fL (ref 80.0–100.0)
Platelets: 243 10*3/uL (ref 150–400)
RBC: 4.3 MIL/uL (ref 3.87–5.11)
RDW: 13.6 % (ref 11.5–15.5)
WBC: 10.5 10*3/uL (ref 4.0–10.5)
nRBC: 0 % (ref 0.0–0.2)

## 2018-07-09 SURGERY — KYPHOPLASTY
Anesthesia: General

## 2018-07-09 MED ORDER — MAGNESIUM CITRATE PO SOLN
1.0000 | Freq: Once | ORAL | Status: DC | PRN
  Filled 2018-07-09: qty 296

## 2018-07-09 MED ORDER — METOCLOPRAMIDE HCL 5 MG/ML IJ SOLN: 5.0000 mg | Freq: Three times a day (TID) | INTRAMUSCULAR | Status: DC | PRN

## 2018-07-09 MED ORDER — FENTANYL CITRATE (PF) 100 MCG/2ML IJ SOLN
INTRAMUSCULAR | Status: DC | PRN
  Administered 2018-07-09 (×2): 50 ug via INTRAVENOUS

## 2018-07-09 MED ORDER — METHOCARBAMOL 500 MG PO TABS: 500.0000 mg | ORAL_TABLET | Freq: Four times a day (QID) | ORAL | Status: DC | PRN

## 2018-07-09 MED ORDER — LACTATED RINGERS IV SOLN
INTRAVENOUS | Status: DC
  Administered 2018-07-09 – 2018-07-10 (×2): via INTRAVENOUS

## 2018-07-09 MED ORDER — METOCLOPRAMIDE HCL 10 MG PO TABS: 5.0000 mg | ORAL_TABLET | Freq: Three times a day (TID) | ORAL | Status: DC | PRN

## 2018-07-09 MED ORDER — DOCUSATE SODIUM 100 MG PO CAPS
100.0000 mg | ORAL_CAPSULE | Freq: Two times a day (BID) | ORAL | Status: DC
  Administered 2018-07-09 – 2018-07-10 (×2): 100 mg via ORAL
  Filled 2018-07-09 (×2): qty 1

## 2018-07-09 MED ORDER — LIDOCAINE HCL 1 % IJ SOLN
INTRAMUSCULAR | Status: DC | PRN
  Administered 2018-07-09: 30 mL

## 2018-07-09 MED ORDER — BISACODYL 5 MG PO TBEC
5.0000 mg | DELAYED_RELEASE_TABLET | Freq: Every day | ORAL | Status: DC | PRN
  Administered 2018-07-10: 5 mg via ORAL
  Filled 2018-07-09: qty 1

## 2018-07-09 MED ORDER — BUPIVACAINE-EPINEPHRINE (PF) 0.5% -1:200000 IJ SOLN
INTRAMUSCULAR | Status: DC | PRN
  Administered 2018-07-09 (×2): 20 mL via PERINEURAL

## 2018-07-09 MED ORDER — CEFAZOLIN SODIUM-DEXTROSE 1-4 GM/50ML-% IV SOLN
INTRAVENOUS | Status: AC
  Filled 2018-07-09: qty 50

## 2018-07-09 MED ORDER — MAGNESIUM HYDROXIDE 400 MG/5ML PO SUSP
30.0000 mL | Freq: Every day | ORAL | Status: DC | PRN
  Administered 2018-07-10: 30 mL via ORAL
  Filled 2018-07-09: qty 30

## 2018-07-09 MED ORDER — PROPOFOL 10 MG/ML IV BOLUS
INTRAVENOUS | Status: AC
  Filled 2018-07-09: qty 20

## 2018-07-09 MED ORDER — PROPOFOL 500 MG/50ML IV EMUL
INTRAVENOUS | Status: DC | PRN
  Administered 2018-07-09: 100 ug/kg/min via INTRAVENOUS

## 2018-07-09 MED ORDER — METHOCARBAMOL 1000 MG/10ML IJ SOLN
500.0000 mg | Freq: Four times a day (QID) | INTRAVENOUS | Status: DC | PRN
  Filled 2018-07-09: qty 5

## 2018-07-09 MED ORDER — IOPAMIDOL (ISOVUE-M 200) INJECTION 41%
INTRAMUSCULAR | Status: DC | PRN
  Administered 2018-07-09: 30 mL

## 2018-07-09 MED ORDER — FENTANYL CITRATE (PF) 100 MCG/2ML IJ SOLN
INTRAMUSCULAR | Status: AC
  Filled 2018-07-09: qty 2

## 2018-07-09 SURGICAL SUPPLY — 16 items
CEMENT KYPHON CX01A KIT/MIXER (Cement) ×3 IMPLANT
COVER WAND RF STERILE (DRAPES) ×3 IMPLANT
DERMABOND ADVANCED (GAUZE/BANDAGES/DRESSINGS) ×2
DERMABOND ADVANCED .7 DNX12 (GAUZE/BANDAGES/DRESSINGS) ×1 IMPLANT
DEVICE BIOPSY BONE KYPH (INSTRUMENTS) ×3 IMPLANT
DRAPE C-ARM XRAY 36X54 (DRAPES) ×3 IMPLANT
DURAPREP 26ML APPLICATOR (WOUND CARE) ×3 IMPLANT
GLOVE SURG SYN 9.0  PF PI (GLOVE) ×2
GLOVE SURG SYN 9.0 PF PI (GLOVE) ×1 IMPLANT
GOWN SRG 2XL LVL 4 RGLN SLV (GOWNS) ×1 IMPLANT
GOWN STRL NON-REIN 2XL LVL4 (GOWNS) ×2
GOWN STRL REUS W/ TWL LRG LVL3 (GOWN DISPOSABLE) ×1 IMPLANT
GOWN STRL REUS W/TWL LRG LVL3 (GOWN DISPOSABLE) ×2
PACK KYPHOPLASTY (MISCELLANEOUS) ×3 IMPLANT
STRAP SAFETY 5IN WIDE (MISCELLANEOUS) ×3 IMPLANT
TRAY KYPHOPAK 15/2 EXPRESS (KITS) ×3 IMPLANT

## 2018-07-09 NOTE — Care Management Note (Signed)
Case Management Note  Patient Details  Name: Emma Mejia MRN: [id number] Date of Birth: [birth year]  Subjective/Objective:                  RNCM met with patient and her daughter Emma Mejia. Patient has been staying with her son Emma Mejia at [place] [address] (her address is in [place]). Patient ambulates with quad cane at baseline. She uses [facility] in [place] for medications. She hopes to have home health PT again through [facility] [phone] fax ; [phone].  PCP Emma Mejia.   Action/Plan: RNCM spoke with Emma Mejia in intake with [facility] home health.  Patient has history of home health through [facility] home health  PT/OT discharged however discharged on 04/12/18. They will review; [phone] faxed notes.   Expected Discharge Date:                  Expected Discharge Plan:     In-House Referral:     Discharge planning Services  CM Consult  Post Acute Care Choice:  Home Health Choice offered to:  Patient, Adult Children  DME Arranged:    DME Agency:     HH Arranged:    [place] Agency:  [facility]  Status of Service:  In process, will continue to follow  If discussed at Long Length of Stay Meetings, dates discussed:    Additional Comments:  Emma Garfinkel, RN 07/09/2018, 1:37 PM

## 2018-07-09 NOTE — Anesthesia Post-op Follow-up Note (Signed)
Anesthesia QCDR form completed.        

## 2018-07-09 NOTE — Care Management (Addendum)
RNCM received callback from [facility] home [facility] Francena HanlyStella stating that they would not be able to meet this patient's needs. I have updated patient's daughter Lamar LaundrySonya. Lamar LaundrySonya has asked if she can call this RNCM back.  The home health agency will need to be able to to patient's address in [place] and son's address in [place].

## 2018-07-09 NOTE — Anesthesia Postprocedure Evaluation (Signed)
Anesthesia Post Note  Patient: [name]  Procedure(s) Performed: KYPHOPLASTY-T10 (N/A )  Patient location during evaluation: PACU Anesthesia Type: General Level of consciousness: awake and alert Pain management: pain level controlled Vital Signs Assessment: post-procedure vital signs reviewed and stable Respiratory status: spontaneous breathing, nonlabored ventilation and respiratory function stable Cardiovascular status: blood pressure returned to baseline and stable Postop Assessment: no signs of nausea or vomiting Anesthetic complications: no     Last Vitals:  Vitals:   07/09/18 1400 07/09/18 1426  BP: (!) 156/74 (!) 159/75  Pulse: 76 72  Resp: 15 18  Temp:  36.8 C  SpO2: 96% 95%    Last Pain:  Vitals:   07/09/18 1426  TempSrc: Oral  PainSc: Asleep                 Amri Lien

## 2018-07-09 NOTE — Progress Notes (Signed)
PT Cancellation Note  Patient Details Name: Emma OpitzWilma Mejia MRN: [id number] DOB: Sep 14, 1945   Cancelled Treatment:    Reason Eval/Treat Not Completed: Other (comment).  Pt still off floor (s/p T10 kyphoplasty today).  Will re-attempt PT evaluation at a later date/time.  Hendricks LimesEmily Valmore Arabie, PT 07/09/18, 2:24 PM [phone]

## 2018-07-09 NOTE — Progress Notes (Signed)
[facility] - [place] at [facility]   PATIENT NAME: Emma Mejia    MR#:  [id number]  DATE OF BIRTH:  04-Feb-1946  SUBJECTIVE:  CHIEF COMPLAINT:   Chief Complaint  Patient presents with  . Fall  back pain, waiting for Kyphoplasty, daughter at bedside REVIEW OF SYSTEMS:  Review of Systems  Constitutional: Negative for chills, fever and weight loss.  HENT: Negative for nosebleeds and sore throat.   Eyes: Negative for blurred vision.  Respiratory: Negative for cough, shortness of breath and wheezing.   Cardiovascular: Negative for chest pain, orthopnea, leg swelling and PND.  Gastrointestinal: Negative for abdominal pain, constipation, diarrhea, heartburn, nausea and vomiting.  Genitourinary: Negative for dysuria and urgency.  Musculoskeletal: Positive for back pain.  Skin: Negative for rash.  Neurological: Negative for dizziness, speech change, focal weakness and headaches.  Endo/Heme/Allergies: Does not bruise/bleed easily.  Psychiatric/Behavioral: Negative for depression.   DRUG ALLERGIES:   Allergies  Allergen Reactions  . Ibuprofen   . Naproxen    VITALS:  Blood pressure (!) 162/90, pulse 89, temperature (!) 97.2 F (36.2 C), temperature source Tympanic, resp. rate 16, height 5\' 3"  (1.6 m), weight 54.4 kg, SpO2 94 %. PHYSICAL EXAMINATION:  Physical Exam  Constitutional: She is oriented to person, place, and time.  HENT:  Head: Normocephalic and atraumatic.  Eyes: Pupils are equal, round, and reactive to light. Conjunctivae and EOM are normal.  Neck: Normal range of motion. Neck supple. No tracheal deviation present. No thyromegaly present.  Cardiovascular: Normal rate, regular rhythm and normal heart sounds.  Pulmonary/Chest: Effort normal and breath sounds normal. No respiratory distress. She has no wheezes. She exhibits no tenderness.  Abdominal: Soft. Bowel sounds are normal. She exhibits no distension. There is no tenderness.    Musculoskeletal:       Lumbar back: She exhibits decreased range of motion and tenderness.  Neurological: She is alert and oriented to person, place, and time. No cranial nerve deficit.  Skin: Skin is warm and dry. No rash noted.   LABORATORY PANEL:  Female CBC Recent Labs  Lab 07/09/18 0318  WBC 10.5  HGB 14.3  HCT 42.7  PLT 243   ------------------------------------------------------------------------------------------------------------------ Chemistries  Recent Labs  Lab 07/07/18 1023 07/09/18 0318  NA 140 138  K 3.5 3.6  CL 104 104  CO2 22 25  GLUCOSE 145* 93  BUN 12 17  CREATININE 0.63 0.73  CALCIUM 9.0 8.7*  AST 24  --   ALT 11  --   ALKPHOS 45  --   BILITOT 1.1  --    RADIOLOGY:  No results found. ASSESSMENT AND PLAN:  Emma Mejia  is a 72 y.o. female with a known history of ongoing tobacco abuse, hypertension admitted s/p fall and T10 compression fracture.  1. T10 compression fracture status post fall mechanical this morning at home -Dr. Rosita KeaMenz planning Kyphoplasty today - PRN pain meds  2. hypoxia suspected due to underlying emphysema/COPD with ongoing heavy tobacco abuse -PRN nebulizer -wean oxygen to office if able to -CT chest shows Severe centrilobular emphysema - Daughter was surprised to know this diagnosis  3. Elevated troponin appears demand ischemia in the setting of elevated blood pressure and history of LVH with uncontrolled hypertension as outpatient -continue aspirin -no history of coronary artery disease  4. Uncontrolled hypertension precipitated by pain -adjust blood pressure meds as need. -PRN hydralazine IV  5. History of hepatitis C cured 2015 per GI notes - f/by Dr Mardee PostinKimberly Weaver  at [facility]      All the records are reviewed and case discussed with Care Management/Social Worker. Management plans discussed with the patient, Daughter at bedside and they are in agreement.  CODE STATUS: Full Code  TOTAL TIME TAKING CARE  OF THIS PATIENT: 35 minutes.   More than 50% of the time was spent in counseling/coordination of care: YES  POSSIBLE D/C IN 1 DAYS, DEPENDING ON CLINICAL CONDITION. And ortho eval   Delfino Lovett M.D on 07/09/2018 at 12:50 PM  Between 7am to 6pm - Pager - [phone]  After 6pm go to [email or web address] - Social research officer, government  [facility] [place] [facility]  Office  [phone]  CC: Primary care physician; Bertram Savin, MD  Note: This dictation was prepared with Dragon dictation along with smaller phrase technology. Any transcriptional errors that result from this process are unintentional.

## 2018-07-09 NOTE — Op Note (Signed)
Date 07/09/18  Time  1:24 pm   PATIENT: Emma Mejia   PRE-OPERATIVE DIAGNOSIS:  closed wedge compression fracture of T 10   POST-OPERATIVE DIAGNOSIS:  closed wedge compression fracture of T 10   PROCEDURE:  Procedure(s): KYPHOPLASTY T 10  SURGEON: Laurene Footman, MD   ASSISTANTS: None   ANESTHESIA:   local and MAC   EBL:  No intake/output data recorded.   BLOOD ADMINISTERED:none   DRAINS: none    LOCAL MEDICATIONS USED:  MARCAINE    and XYLOCAINE    SPECIMEN:  none   DISPOSITION OF SPECIMEN: none   COUNTS:  YES   TOURNIQUET:  * No tourniquets in log *   IMPLANTS: Bone cement   DICTATION: .Dragon Dictation  patient was brought to the operating room and after adequate anesthesia was obtained the patient was placed prone.  C arm was brought in in good visualization of the affected level obtained on both AP and lateral projections.  After patient identification and timeout procedures were completed, local anesthetic was infiltrated with 10 cc 1% Xylocaine infiltrated subcutaneously.  This is done the area on the right side of the planned approach.  The back was then prepped and draped you sterile manner and repeat timeout procedure carried out.  A spinal needle was brought down to the pedicle on the right  And left sides of T 10 and a 50-50 mix of 1% Xylocaine half percent Sensorcaine with epinephrine total of 20 cc injected.  After allowing this to set a small incision was made and the trocar was advanced into the vertebral body in an transpedicular fashion.  Biopsy was not obtained despite attempting this Drilling was carried out.  Identical procedure carried out on left side except more of an extrapedicular approach area balloon inserted with inflation to  2 cc on the right and 3 on the left .  When the cement was appropriate consistency approximately 5 cc were injected into the vertebral body without extravasation, good fill superior to inferior endplates and from right to  left sides along the inferior endplate.  After the cement had set the trochar was removed and permanent C-arm views obtained.  The wound was closed with Dermabond followed by Band-Aid   PLAN OF CARE:  Continue as patient   PATIENT DISPOSITION:  PACU - hemodynamically stable.

## 2018-07-09 NOTE — Progress Notes (Signed)
15 minute call to floor. 

## 2018-07-09 NOTE — Transfer of Care (Signed)
Immediate Anesthesia Transfer of Care Note  Patient: Furniture conservator/restorer  Procedure(s) Performed: Andres Shad (N/A )  Patient Location: PACU  Anesthesia Type:MAC  Level of Consciousness: awake  Airway & Oxygen Therapy: Patient Spontanous Breathing and Patient connected to nasal cannula oxygen  Post-op Assessment: Report given to RN and Post -op Vital signs reviewed and stable  Post vital signs: Reviewed and stable  Last Vitals:  Vitals Value Taken Time  BP    Temp    Pulse    Resp    SpO2      Last Pain:  Vitals:   07/09/18 1146  TempSrc: Tympanic  PainSc:       Patients Stated Pain Goal: 4 (53/97/67 3419)  Complications: No apparent anesthesia complications

## 2018-07-09 NOTE — Progress Notes (Signed)
PT Cancellation Note  Patient Details Name: Emma OpitzWilma Mejia MRN: [id number] DOB: 03/27/1946   Cancelled Treatment:    Reason Eval/Treat Not Completed: Other (comment).  Pt currently off floor (plan for T10 kyphoplasty today).  Will attempt PT evaluation at a later date/time as medically appropriate.  Hendricks LimesEmily Vuk Skillern, PT 07/09/18, 11:25 AM [phone]

## 2018-07-10 ENCOUNTER — Encounter: Payer: Self-pay | Admitting: Orthopedic Surgery

## 2018-07-10 LAB — BASIC METABOLIC PANEL
Anion gap: 7 (ref 5–15)
BUN: 16 mg/dL (ref 8–23)
CO2: 25 mmol/L (ref 22–32)
Calcium: 8.6 mg/dL — ABNORMAL LOW (ref 8.9–10.3)
Chloride: 108 mmol/L (ref 98–111)
Creatinine, Ser: 0.7 mg/dL (ref 0.44–1.00)
GFR calc Af Amer: >60 mL/min (ref >60)
GFR calc non Af Amer: >60 mL/min (ref >60)
Glucose, Bld: 96 mg/dL (ref 70–99)
Potassium: 3.5 mmol/L (ref 3.5–5.1)
Sodium: 140 mmol/L (ref 135–145)

## 2018-07-10 LAB — CBC
HEMATOCRIT: 42.8 % (ref 36.0–46.0)
Hemoglobin: 14.1 g/dL (ref 12.0–15.0)
MCH: 33.2 pg (ref 26.0–34.0)
MCHC: 32.9 g/dL (ref 30.0–36.0)
MCV: 100.7 fL — ABNORMAL HIGH (ref 80.0–100.0)
Platelets: 241 10*3/uL (ref 150–400)
RBC: 4.25 MIL/uL (ref 3.87–5.11)
RDW: 13.8 % (ref 11.5–15.5)
WBC: 8.4 10*3/uL (ref 4.0–10.5)
nRBC: 0 % (ref 0.0–0.2)

## 2018-07-10 MED ORDER — OXYCODONE HCL 5 MG PO TABS: 5.0000 mg | ORAL_TABLET | Freq: Two times a day (BID) | ORAL | 0 refills | Status: AC | PRN

## 2018-07-10 MED ORDER — METHOCARBAMOL 500 MG PO TABS: 500.0000 mg | ORAL_TABLET | Freq: Three times a day (TID) | ORAL | 0 refills | Status: DC | PRN

## 2018-07-10 NOTE — Evaluation (Signed)
Physical Therapy Evaluation Patient Details Name: Emma OpitzWilma [place] MRN: [id number] DOB: [birth year] Today's Date: 07/10/2018   History of Present Illness  Pt is a 72 y.o. female presenting to hospital 07/07/18 with back pain after a fall (slipped on porch) landing on buttocks.  Pt noted with T10 compression fx and elevated troponin (appears to be d/t demand ischemia).  Pt s/p kyphoplasty T10 07/09/18.  PMH includes htn, h/o brain aneurysm, h/o brain surgery, COPD, (+) smoker.  Clinical Impression  Prior to hospital admission, pt reports being ambulatory without AD.  Pt lives with her son in 1 level home with 5-6 steps to enter with B railings.  Currently pt is modified independent with logrolling supine to sit; CGA with transfers; CGA to SBA with ambulation 160 feet with small based quad cane; and CGA navigating 4 stairs with railings.  Pt able to verbalize spinal precautions.  Pt would benefit from skilled PT to address noted impairments and functional limitations (see below for any additional details).  Upon hospital discharge, recommend pt discharge to home with support of family and HHPT.    Follow Up Recommendations Home health PT    Equipment Recommendations  (quad cane (pt reports having own quad cane at home))    Recommendations for Other Services       Precautions / Restrictions Precautions Precautions: Fall;Back Precaution Comments: Spinal precautions Restrictions Weight Bearing Restrictions: No      Mobility  Bed Mobility Overal bed mobility: Modified Independent             General bed mobility comments: Supine to sit via logrolling with mild increased effort to perform on own.  Transfers Overall transfer level: Needs assistance Equipment used: Quad cane Transfers: Sit to/from RaytheonStand;Stand Pivot Transfers Sit to Stand: Min guard Stand pivot transfers: Min guard       General transfer comment: fairly strong stand noted; appearing steady with transfers using quad  cane  Ambulation/Gait Ambulation/Gait assistance: Min guard;Supervision Gait Distance (Feet): 160 Feet Assistive device: Quad cane   Gait velocity: decreased   General Gait Details: initially short B step length but improved to partial step through gait pattern; steady with quad cane use  Stairs Stairs: Yes Stairs assistance: Min guard Stair Management: Two rails Number of Stairs: 4 General stair comments: alternating stepping pattern ascending stairs; step to pattern descending stairs; steady with support of railing  Wheelchair Mobility    Modified Rankin (Stroke Patients Only)       Balance Overall balance assessment: Needs assistance Sitting-balance support: No upper extremity supported;Feet supported Sitting balance-Leahy Scale: Normal Sitting balance - Comments: steady sitting reaching outside BOS   Standing balance support: No upper extremity supported Standing balance-Leahy Scale: Good Standing balance comment: steady standing reaching within BOS                             Pertinent Vitals/Pain   Vitals (HR and O2 on room air) stable and WFL throughout treatment session.    Home Living Family/patient expects to be discharged to:: Private residence Living Arrangements: Children(Pt's son) Available Help at Discharge: Family Type of Home: House Home Access: Stairs to enter Entrance Stairs-Rails: Right;Left;Can reach both Secretary/administratorntrance Stairs-Number of Steps: 5-6 Home Layout: One level Home Equipment: Grab bars - toilet;Cane - quad      Prior Function Level of Independence: Independent         Comments: Pt reports most recently not using any AD for ambulation (graduated  from HHPT where she had been using quad cane)     Hand Dominance        Extremity/Trunk Assessment   Upper Extremity Assessment Upper Extremity Assessment: Generalized weakness    Lower Extremity Assessment Lower Extremity Assessment: Generalized weakness    Cervical  / Trunk Assessment Cervical / Trunk Assessment: Normal  Communication   Communication: No difficulties  Cognition Arousal/Alertness: Awake/alert Behavior During Therapy: WFL for tasks assessed/performed Overall Cognitive Status: Within Functional Limits for tasks assessed                                        General Comments   Nursing cleared pt for participation in physical therapy.  Pt agreeable to PT session.    Exercises  Transfer and gait training   Assessment/Plan    PT Assessment Patient needs continued PT services  PT Problem List Decreased strength;Decreased balance;Decreased activity tolerance;Decreased mobility;Decreased knowledge of use of DME;Pain       PT Treatment Interventions DME instruction;Gait training;Stair training;Functional mobility training;Therapeutic activities;Therapeutic exercise;Balance training;Patient/family education    PT Goals (Current goals can be found in the Care Plan section)  Acute Rehab PT Goals Patient Stated Goal: to go home PT Goal Formulation: With patient Time For Goal Achievement: 07/24/18 Potential to Achieve Goals: Good    Frequency Min 2X/week   Barriers to discharge        Co-evaluation               AM-PAC PT "6 Clicks" Mobility  Outcome Measure Help needed turning from your back to your side while in a flat bed without using bedrails?: None Help needed moving from lying on your back to sitting on the side of a flat bed without using bedrails?: None Help needed moving to and from a bed to a chair (including a wheelchair)?: A Little Help needed standing up from a chair using your arms (e.g., wheelchair or bedside chair)?: A Little Help needed to walk in hospital room?: A Little Help needed climbing 3-5 steps with a railing? : A Little 6 Click Score: 20    End of Session Equipment Utilized During Treatment: Gait belt Activity Tolerance: Patient tolerated treatment well Patient left: in  chair;with call bell/phone within reach;with chair alarm set Nurse Communication: Mobility status;Precautions PT Visit Diagnosis: Other abnormalities of gait and mobility (R26.89);Muscle weakness (generalized) (M62.81);Difficulty in walking, not elsewhere classified (R26.2)    Time: 1610-9604 PT Time Calculation (min) (ACUTE ONLY): 40 min   Charges:   PT Evaluation $PT Eval Low Complexity: 1 Low PT Treatments $Gait Training: 8-22 mins $Therapeutic Activity: 8-22 mins       Hendricks Limes, PT 07/10/18, 2:28 PM [phone]

## 2018-07-10 NOTE — Discharge Instructions (Signed)
Balloon Kyphoplasty, Care After Refer to this sheet in the next few weeks. These instructions provide you with information about caring for yourself after your procedure. Your health care provider may also give you more specific instructions. Your treatment has been planned according to current medical practices, but problems sometimes occur. Call your health care provider if you have any problems or questions after your procedure. What can I expect after the procedure? After your procedure, it is common to have back pain. Follow these instructions at home: Incision care  Follow instructions from your health care provider about how to take care of your incisions. Make sure you: ? Wash your hands with soap and water before you change your bandage (dressing). If soap and water are not available, use hand sanitizer. ? Change your dressing as told by your health care provider. ? Leave stitches (sutures), skin glue, or adhesive strips in place. These skin closures may need to be in place for 2 weeks or longer. If adhesive strip edges start to loosen and curl up, you may trim the loose edges. Do not remove adhesive strips completely unless your health care provider tells you to do that.  Check your incision area every day for signs of infection. Watch for: ? Redness, swelling, or pain. ? Fluid, blood, or pus.  Keep your dressing dry until your health care provider says that it can be removed. Activity   Rest your back and avoid intense physical activity for as long as told by your health care provider.  Return to your normal activities as told by your health care provider. Ask your health care provider what activities are safe for you.  Do not lift anything that is heavier than 10 lb (4.5 kg). This is about the weight of a gallon of milk.You may need to avoid heavy lifting for several weeks. General instructions  Take over-the-counter and prescription medicines only as told by your health care  provider.  If directed, apply ice to the painful area: ? Put ice in a plastic bag. ? Place a towel between your skin and the bag. ? Leave the ice on for 20 minutes, 2-3 times per day.  Do not use tobacco products, including cigarettes, chewing tobacco, or e-cigarettes. If you need help quitting, ask your health care provider.  Keep all follow-up visits as told by your health care provider. This is important. Contact a health care provider if:  You have a fever.  You have redness, swelling, or pain at the site of your incisions.  You have fluid, blood, or pus coming from your incisions.  You have pain that gets worse or does not get better with medicine.  You develop numbness or weakness in any part of your body. Get help right away if:  You have chest pain.  You have difficulty breathing.  You cannot move your legs.  You cannot control your bladder or bowel movements.  You suddenly become weak or numb on one side of your body.  You become very confused.  You have trouble speaking or understanding, or both. This information is not intended to replace advice given to you by your health care provider. Make sure you discuss any questions you have with your health care provider. Document Released: 04/14/2015 Document Revised: 12/30/2015 Document Reviewed: 11/16/2014 Elsevier Interactive Patient Education  2018 [employer].     Vertebral Fracture A vertebral fracture means that one of the bones in the spine is broken (fractured). These bones are called vertebrae. You  may have back pain that gets worse when you move. Vertebral fractures can be mild or severe. Many will get better without surgery. Surgery may be needed for more severe breaks. Follow these instructions at home: General instructions  Take medicines only as told by your doctor.  Do not drive or use heavy machinery while taking pain medicine.  If told, put ice on the injured area: ? Put ice in a plastic  bag. ? Place a towel between your skin and the bag. ? Leave the ice on for 30 minutes every 2 hours at first. Then use the ice as needed.  Wear your neck brace or back brace as told by your doctor.  Do not drink alcohol.  Keep all follow-up visits as told by your doctor. This is important. Activity  Stay in bed (on bed rest) only as told by your doctor. Being on bed rest for too long can make your condition worse.  Return to your normal activities as told by your doctor. Ask your doctor what is safe for you to do.  Do exercises for your back (physical therapy) as told by your doctor.  Exercise often as told by your doctor. Contact a doctor if:  You have a fever.  You have a cough that makes your pain worse.  Your pain medicine is not helping.  Your pain does not get better over time.  You cannot return to your normal activities as planned. Get help right away if:  Your pain is very bad and it suddenly gets worse.  You are not able to move any body part (paralysis) that is below the level of your injury.  You have numbness, tingling, or weakness in any body part that is below the level of your injury.  You cannot control when you pee (urinate) or when you poop (have bowel movements). This information is not intended to replace advice given to you by your health care provider. Make sure you discuss any questions you have with your health care provider. Document Released: 01/11/2010 Document Revised: 12/30/2015 Document Reviewed: 07/29/2014 Elsevier Interactive Patient Education  [employer]2018 Elsevier Inc.

## 2018-07-10 NOTE — Care Management Important Message (Signed)
Important Message  Patient Details  Name: Drucie OpitzWilma Speigner MRN: [id number] Date of Birth: Nov 19, 1945   Medicare Important Message Given:  Yes    Olegario MessierKathy A Jaylynn Mcaleer 07/10/2018, 10:46 AM

## 2018-07-10 NOTE — Care Management (Signed)
[facility] for [facility]. No other RNCM needs.

## 2018-07-10 NOTE — Care Management (Addendum)
Patient and daughter Emma Mejia is still adamant about using [facility] home health however they told me "no".  [facility] and they declined. Checking with [facility] now. Update: [facility] can take if [place] stays in [place].  I spoke with daughter Emma Mejia and [place] in [place] for [place] week.  I am now checking with [facility].

## 2018-07-12 NOTE — Discharge Summary (Signed)
[facility] -  at [facility]   PATIENT NAME: Emma Mejia    MR#:  [id number]  DATE OF BIRTH:  [birth year]  DATE OF ADMISSION:  07/07/2018   ADMITTING PHYSICIAN: Enedina Finner, MD  DATE OF DISCHARGE: 07/10/2018 12:00 PM  PRIMARY CARE PHYSICIAN: Bertram Savin, MD   ADMISSION DIAGNOSIS:  Elevated troponin [R79.89] COPD exacerbation (HCC) [J44.1] Fall, initial encounter [W19.XXXA] Compression fracture of body of thoracic vertebra (HCC) [S22.000A] DISCHARGE DIAGNOSIS:  Active Problems:   Compression fracture of body of thoracic vertebra (HCC)  SECONDARY DIAGNOSIS:   Past Medical History:  Diagnosis Date  . Brain aneurysm   . Hypertension   . Thyroid disease    HOSPITAL COURSE:  WilmaBellangeris a72 y.o.femalewith a known history of ongoing tobacco abuse, hypertension admitted s/p fall andT10 compression fracture.  1.T10 compression fracture status post fall mechanical this morning at home -s/p Kyphoplasty by Dr Rosita Kea and doing better  2.hypoxia suspected due to underlying emphysema/COPD with ongoing heavy tobacco abuse -CT chest shows Severe centrilobular emphysema - 2 liters O2 at D/C  3.Elevated troponin appears demand ischemia in the setting of elevated blood pressure and history of LVH with uncontrolled hypertension   4.Uncontrolled hypertension precipitated by pain -much improved s/p pain control after Kyphoplasty  5. History of hepatitis C cured 2015 per GI notes - f/by Dr Mardee Postin at [facility] GI DISCHARGE CONDITIONS:  stable CONSULTS OBTAINED:  Treatment Team:  Kennedy Bucker, MD DRUG ALLERGIES:   Allergies  Allergen Reactions  . Ibuprofen   . Naproxen    DISCHARGE MEDICATIONS:   Allergies as of 07/10/2018      Reactions   Ibuprofen    Naproxen       Medication List    TAKE these medications   alendronate 70 MG tablet Commonly known as:  FOSAMAX Take 70 mg by mouth once a week. Take with a full  glass of water on an empty stomach.   aspirin EC 81 MG tablet Take 81 mg by mouth daily.   cholecalciferol 25 MCG (1000 UT) tablet Commonly known as:  VITAMIN D Take 1,000 Units by mouth daily.   levothyroxine 75 MCG tablet Commonly known as:  SYNTHROID, LEVOTHROID Take 75 mcg by mouth daily before breakfast.   lisinopril 20 MG tablet Commonly known as:  PRINIVIL,ZESTRIL Take 40 mg by mouth daily.   methocarbamol 500 MG tablet Commonly known as:  ROBAXIN Take 1 tablet (500 mg total) by mouth every 8 (eight) hours as needed for muscle spasms.   metoprolol succinate 100 MG 24 hr tablet Commonly known as:  TOPROL-XL Take 100 mg by mouth daily.   oxyCODONE 5 MG immediate release tablet Commonly known as:  Oxy IR/ROXICODONE Take 1 tablet (5 mg total) by mouth 2 (two) times daily as needed for up to 3 days for severe pain or breakthrough pain.   pantoprazole 20 MG tablet Commonly known as:  PROTONIX Take 20 mg by mouth daily.        DISCHARGE INSTRUCTIONS:   DIET:  Cardiac diet DISCHARGE CONDITION:  Stable ACTIVITY:  Activity as tolerated OXYGEN:  Home Oxygen: Yes.    Oxygen Delivery: 2 liters/min via Patient connected to nasal cannula oxygen DISCHARGE LOCATION:  Home with Home Health, PT, RN   If you experience worsening of your admission symptoms, develop shortness of breath, life threatening emergency, suicidal or homicidal thoughts you must seek medical attention immediately by calling 911 or calling your MD immediately  if symptoms  less severe.  You Must read complete instructions/literature along with all the possible adverse reactions/side effects for all the Medicines you take and that have been prescribed to you. Take any new Medicines after you have completely understood and accpet all the possible adverse reactions/side effects.   Please note  You were cared for by a hospitalist during your hospital stay. If you have any questions about your discharge  medications or the care you received while you were in the hospital after you are discharged, you can call the unit and asked to speak with the hospitalist on call if the hospitalist that took care of you is not available. Once you are discharged, your primary care physician will handle any further medical issues. Please note that NO REFILLS for any discharge medications will be authorized once you are discharged, as it is imperative that you return to your primary care physician (or establish a relationship with a primary care physician if you do not have one) for your aftercare needs so that they can reassess your need for medications and monitor your lab values.    On the day of Discharge:  VITAL SIGNS:  Blood pressure 131/61, pulse 76, temperature (!) 97.5 F (36.4 C), temperature source Oral, resp. rate 16, height 5\' 3"  (1.6 m), weight 54.4 kg, SpO2 97 %. PHYSICAL EXAMINATION:  GENERAL:  72 y.o.-year-old patient lying in the bed with no acute distress.  EYES: Pupils equal, round, reactive to light and accommodation. No scleral icterus. Extraocular muscles intact.  HEENT: Head atraumatic, normocephalic. Oropharynx and nasopharynx clear.  NECK:  Supple, no jugular venous distention. No thyroid enlargement, no tenderness.  LUNGS: Normal breath sounds bilaterally, no wheezing, rales,rhonchi or crepitation. No use of accessory muscles of respiration.  CARDIOVASCULAR: S1, S2 normal. No murmurs, rubs, or gallops.  ABDOMEN: Soft, non-tender, non-distended. Bowel sounds present. No organomegaly or mass.  EXTREMITIES: No pedal edema, cyanosis, or clubbing.  NEUROLOGIC: Cranial nerves II through XII are intact. Muscle strength 5/5 in all extremities. Sensation intact. Gait not checked.  PSYCHIATRIC: The patient is alert and oriented x 3.  SKIN: No obvious rash, lesion, or ulcer.  DATA REVIEW:   CBC Recent Labs  Lab 07/10/18 0557  WBC 8.4  HGB 14.1  HCT 42.8  PLT 241    Chemistries  Recent  Labs  Lab 07/07/18 1023  07/10/18 0557  NA 140   < > 140  K 3.5   < > 3.5  CL 104   < > 108  CO2 22   < > 25  GLUCOSE 145*   < > 96  BUN 12   < > 16  CREATININE 0.63   < > 0.70  CALCIUM 9.0   < > 8.6*  AST 24  --   --   ALT 11  --   --   ALKPHOS 45  --   --   BILITOT 1.1  --   --    < > = values in this interval not displayed.     Follow-up Information    Feltner, Clayton Biblesynthia K, MD. Schedule an appointment as soon as possible for a visit on 07/15/2018.   Specialty:  Internal Medicine Why:  @ 2:00 pm Contact information: [address] Medicine [id number] [facility] [place] [address] [phone]        Evon SlackGaines, Thomas C, PA-C. Schedule an appointment as soon as possible for a visit on 07/26/2018.   Specialties:  Orthopedic Surgery, Emergency Medicine  Why:  @ 2:30 pm Contact information: [address] [place] [phone]            Management plans discussed with the patient, family and they are in agreement.  CODE STATUS: Prior   TOTAL TIME TAKING CARE OF THIS PATIENT: 45 minutes.    Delfino Lovett M.D on 07/12/2018 at 7:02 PM  Between 7am to 6pm - Pager - [phone]  After 6pm go to [email or web address] - Social research officer, government  [facility] [place] [facility]  Office  [phone]  CC: Primary care physician; Bertram Savin, MD   Note: This dictation was prepared with Dragon dictation along with smaller phrase technology. Any transcriptional errors that result from this process are unintentional.

## 2018-07-17 ENCOUNTER — Other Ambulatory Visit: Payer: Medicare Other | Admitting: Student

## 2018-07-29 ENCOUNTER — Other Ambulatory Visit: Payer: Medicare Other | Admitting: Student

## 2018-07-29 DIAGNOSIS — Z515 Encounter for palliative care: Secondary | ICD-10-CM

## 2018-07-29 NOTE — Progress Notes (Signed)
[facility] Telephone: [phone] Fax: [phone]) [phone]  PATIENT NAME: Emma OpitzWilma Mejia DOB: 1945/09/05 MRN: [id number]  PRIMARY CARE PROVIDER:   Bertram SavinFeltner, Cynthia K, MD  REFERRING PROVIDER:  Bertram SavinFeltner, Cynthia K, MD [address] Medicine [address] [id number] [facility] [place], [place] [address]  RESPONSIBLE PARTY: son, Lamarr LulasDerrick Mejia at [phone]     ASSESSMENT:  Ms. Emma Mejia is alert and oriented x 3. Palliative Medicine role explained. We discussed goals of care; patient would like to be as independent as possible and return to her home. We discussed symptom management. We discussed code status. Patient does have a Living Will. She does want CPR attempted.      RECOMMENDATIONS and PLAN:  1. Code status: Full Code; Ms. Emma Mejia would like for CPR to be attempted. 2. Medical goals of therapy: At this time, goals of therapy focus on patient receiving PT, OT and SN [facility]. 3. Symptom management: pain- she is encouraged to take methocarbamol 500mg  TD prn, apply diclofenac gel 1% QID to her back. Apply moistened ice pack for 20 minutes at a time prn.  4. Discharge Planning: Patient will continue to reside at her son Emma's home; continue therapy as directed. 5. Emotional/spiritual support: Discussed with Ms. Emma Mejia, son Emma Mejia and daughter Emma Mejia via phone call; they are encouraged to call with questions.  Palliative Care to continue to follow for emotional/spiritual support, ongoing discussions trajectory of chronic disease progression, medical goals of therapy, monitor for symptoms with management, and reduce ED and hospitalizations with recommendations. Palliative Medicine to follow up in one week via phone or sooner, if needed  I spent 60 minutes providing this consultation,  from 1:30pm to 2:30pm. More than 50% of the time in this consultation was spent coordinating communication.   HISTORY OF PRESENT ILLNESS:  Emma Mejia is a 72  y.o. year old female with multiple medical problems including COPD. She was recently hospitalized 12/1-12/4/19 with elevated troponin, COPD exacerbation, mechanical fall, T10 vertebral fracture status post kyphoplasty Diagnoses also include hypertension and thyroid disease. Palliative Care was asked to help address goals of care, disease progression and symptom management. Ms. Emma Mejia is currently residing at her son's home. She is receiving [facility] Health PT, OT and [facility]. She reports a sharp, constant back pain. She reports pain sometimes being a 10/10. She has been taking acetaminophen prn, methocarbamol 500mg  once a day; also receiving diclofenac gel 1%, which she states improves her pain. Family reports pain is not improving yet overall, which has hindered her independence. She denies dyspnea. She reports fair appetite; she denies weight loss. She reports sleeping well at night. She is ambulatory; uses cane. She plans to [place] 08[place]16/2018. She reports a ? umbilical hernia; she denies nausea, vomiting or constipation.  CODE STATUS: Full Code  PPS: 60% HOSPICE ELIGIBILITY/DIAGNOSIS: TBD  PAST MEDICAL HISTORY:  Past Medical History:  Diagnosis Date  . Brain aneurysm   . Hypertension   . Thyroid disease     SOCIAL HX:  Social History   Tobacco Use  . Smoking status: Current Every Day Smoker  . Smokeless tobacco: Never Used  Substance Use Topics  . Alcohol use: Not Currently    ALLERGIES:  Allergies  Allergen Reactions  . Ibuprofen   . Naproxen      PERTINENT MEDICATIONS:  Outpatient Encounter Medications as of 07/29/2018  Medication Sig  . alendronate (FOSAMAX) 70 MG tablet Take 70 mg by  mouth once a week. Take with a full glass of water on an empty stomach.  Marland Kitchen. aspirin EC 81 MG tablet Take 81 mg by mouth daily.  . cholecalciferol (VITAMIN D) 25 MCG (1000 UT) tablet Take 1,000 Units by mouth daily.  . diclofenac sodium (VOLTAREN)  1 % GEL Apply 2 g topically 4 (four) times daily.  . fluticasone (VERAMYST) 27.5 MCG/SPRAY nasal spray Place 2 sprays into the nose daily.  Marland Kitchen. levothyroxine (SYNTHROID, LEVOTHROID) 75 MCG tablet Take 75 mcg by mouth daily before breakfast.  . lisinopril (PRINIVIL,ZESTRIL) 20 MG tablet Take 40 mg by mouth daily.  . methocarbamol (ROBAXIN) 500 MG tablet Take 1 tablet (500 mg total) by mouth every 8 (eight) hours as needed for muscle spasms.  . metoprolol succinate (TOPROL-XL) 100 MG 24 hr tablet Take 100 mg by mouth daily.  . pantoprazole (PROTONIX) 20 MG tablet Take 20 mg by mouth daily.   No facility-administered encounter medications on file as of 07/29/2018.     PHYSICAL EXAM:   General: NAD, frail appearing, thin Cardiovascular: regular rate and rhythm Pulmonary: clear ant fields Abdomen: soft, nontender, + bowel sounds GU: no suprapubic tenderness Extremities: no edema, no joint deformities Skin: no rashes Neurological: Weakness but otherwise nonfocal  Luella CookLaToya S Eyan Hagood, NP

## 2018-08-28 ENCOUNTER — Other Ambulatory Visit (HOSPITAL_COMMUNITY): Payer: Self-pay | Admitting: Orthopedic Surgery

## 2018-08-28 ENCOUNTER — Other Ambulatory Visit: Payer: Self-pay | Admitting: Orthopedic Surgery

## 2018-08-28 DIAGNOSIS — S22000A Wedge compression fracture of unspecified thoracic vertebra, initial encounter for closed fracture: Secondary | ICD-10-CM

## 2018-09-03 ENCOUNTER — Other Ambulatory Visit: Payer: Medicare Other | Admitting: Student

## 2018-09-03 DIAGNOSIS — Z515 Encounter for palliative care: Secondary | ICD-10-CM

## 2018-09-03 NOTE — Progress Notes (Signed)
[facility] Telephone: [phone] Fax: [phone]  PATIENT NAME: Emma Mejia DOB: [birth year] MRN: [id number]  PRIMARY CARE PROVIDER:   Bertram Savin, MD  REFERRING PROVIDER:  Bertram Savin, MD [address] Medicine [address] [facility] [place], [place] [address]  RESPONSIBLE PARTY: son, Emma Mejia  ASSESSMENT:  Emma Mejia answers the door upon arrival. She has pleasant affect; no acute distress noted. We discussed her goals of care and symptom management. She has follow up appointment with Dr. Rosita Kea on 09/09/2018 and appointment with Dr. Marjory Sneddon on 09/10/2018.      RECOMMENDATIONS and PLAN:  1. 1. Code status: Full Code; Emma Mejia would like for CPR to be attempted. 2. Medical goals of therapy: She is to follow up with orthopedics on 09/09/2018 regarding T11 compression fracture. 3. Symptom management: pain- Continue acetaminophen 500mg  two tabs twice daily; apply diclofenac gel 1% QID to her back. 4. Discharge Planning: Patient will continue to reside at her son Emma Mejia's home. Uncertain when she will return to home in [place]. 5. Emotional/spiritual support: Discussed with Emma Mejia and daughter Emma Mejia via phone call; they are encouraged to call with questions.  Palliative Care to continue to follow for emotional/spiritual support, ongoing discussions trajectory of chronic disease progression, medical goals of therapy, monitor for symptoms with management, and reduce ED and hospitalizations with recommendations. Palliative Medicine to follow up in one week via phone or sooner, if needed  I spent 40 minutes providing this consultation,  from 2:00pm to 2:40pm. More than 50% of the time in this consultation was spent coordinating communication.   HISTORY OF PRESENT ILLNESS:  Emma Mejia is a 73 y.o. female with multiple medical problems COPD, mechanical fall, T10 vertebral fracture status post kyphoplasty, hypertension and  thyroid disease. Palliative Care was asked to help address goals of care; she is seen for a follow up visit today. Since last visit, there was no improvement in he pain and she was found to have a T11 compression fracture. Emma Mejia is still residing at her son's home. She is not receiving any therapy at the time; therapy was placed on hold after it was discovered she had a T11 compression fracture. She states she is to follow up with Orthopedics next week and will have an MRI since she is still having pain and that she may have to have another surgery. She reports "pain is fine when sitting." She states pain can be severe when standing for several minutes. She is taking acetaminophen twice daily and using diclofenac 1% gel prn; she denies need for further intervention at this time. She states she is ambulating about home and sometimes uses walker or cane. She states that her right foot sometimes drags; which has started since her last fall. She denies any recent falls. She reports being able to complete her adl's herself. She reports occasional dyspnea with exertion. She denies chest pain. She reports her appetite being better; she denies weight loss. She reports sleeping well at night.    CODE STATUS: Full Code  PPS: 60% HOSPICE ELIGIBILITY/DIAGNOSIS: TBD  PAST MEDICAL HISTORY:  Past Medical History:  Diagnosis Date  . Brain aneurysm   . Hypertension   . Thyroid disease     SOCIAL HX:  Social History   Tobacco Use  . Smoking status: Current Every Day Smoker  . Smokeless tobacco: Never Used  Substance Use Topics  . Alcohol use: Not Currently    ALLERGIES:  Allergies  Allergen Reactions  .  Ibuprofen   . Naproxen      PERTINENT MEDICATIONS:  Outpatient Encounter Medications as of 09/03/2018  Medication Sig  . alendronate (FOSAMAX) 70 MG tablet Take 70 mg by mouth once a week. Take with a full glass of water on an empty stomach.  Marland Kitchen. aspirin EC 81 MG tablet Take 81 mg by mouth  daily.  . cholecalciferol (VITAMIN D) 25 MCG (1000 UT) tablet Take 1,000 Units by mouth daily.  . diclofenac sodium (VOLTAREN) 1 % GEL Apply 2 g topically 4 (four) times daily.  . fluticasone (VERAMYST) 27.5 MCG/SPRAY nasal spray Place 2 sprays into the nose daily.  Marland Kitchen. levothyroxine (SYNTHROID, LEVOTHROID) 75 MCG tablet Take 75 mcg by mouth daily before breakfast.  . lisinopril (PRINIVIL,ZESTRIL) 20 MG tablet Take 40 mg by mouth daily.  . metoprolol succinate (TOPROL-XL) 100 MG 24 hr tablet Take 100 mg by mouth daily.  . pantoprazole (PROTONIX) 20 MG tablet Take 20 mg by mouth daily.  . methocarbamol (ROBAXIN) 500 MG tablet Take 1 tablet (500 mg total) by mouth every 8 (eight) hours as needed for muscle spasms. (Patient not taking: Reported on 09/03/2018)   No facility-administered encounter medications on file as of 09/03/2018.     PHYSICAL EXAM:   General: NAD,  thin Cardiovascular: regular rate and rhythm Pulmonary: clear ant fields Abdomen: soft, nontender, + bowel sounds GU: no suprapubic tenderness Extremities: no edema, no joint deformities Skin: no rashes Neurological: Weakness but otherwise nonfocal  Emma CookLaToya S Sherryann Frese, NP

## 2018-10-07 DIAGNOSIS — H903 Sensorineural hearing loss, bilateral: Secondary | ICD-10-CM | POA: Insufficient documentation

## 2018-10-09 ENCOUNTER — Telehealth: Payer: Self-pay | Admitting: Nurse Practitioner

## 2018-10-09 NOTE — Telephone Encounter (Signed)
Called and left message to return call to schedule in-home PC visit

## 2018-10-16 ENCOUNTER — Telehealth: Payer: Self-pay

## 2018-10-16 NOTE — Telephone Encounter (Signed)
Messages left on cell phone and home phone to schedule palliative follow up appt.

## 2018-10-22 ENCOUNTER — Telehealth: Payer: Self-pay

## 2018-10-22 NOTE — Telephone Encounter (Signed)
3rd attempt to reach patient via phone to schedule follow up palliative care appointment- No answer, message left

## 2018-11-18 ENCOUNTER — Telehealth: Payer: Self-pay

## 2018-11-18 NOTE — Telephone Encounter (Signed)
Follow up visit scheduled with Palliative NP for 12/10/2018. Due to the current COVID-19 infection/crises, the patient and family prefer, and have given their verbal consent for, a provider visit via telemedicine. HIPPA policies of confidentially were discussed and patient/family expressed understanding.

## 2018-11-23 ENCOUNTER — Other Ambulatory Visit: Payer: Self-pay

## 2018-11-23 ENCOUNTER — Emergency Department: Payer: Medicare Other

## 2018-11-23 ENCOUNTER — Inpatient Hospital Stay
Admission: EM | Admit: 2018-11-23 | Discharge: 2018-11-26 | DRG: 517 | Disposition: A | Discharge status: Skilled Nursing Facility | Payer: Medicare Other | Attending: Internal Medicine | Admitting: Internal Medicine

## 2018-11-23 DIAGNOSIS — Z79899 Other long term (current) drug therapy: Secondary | ICD-10-CM

## 2018-11-23 DIAGNOSIS — F17210 Nicotine dependence, cigarettes, uncomplicated: Secondary | ICD-10-CM | POA: Diagnosis present

## 2018-11-23 DIAGNOSIS — Z7983 Long term (current) use of bisphosphonates: Secondary | ICD-10-CM

## 2018-11-23 DIAGNOSIS — S22000A Wedge compression fracture of unspecified thoracic vertebra, initial encounter for closed fracture: Secondary | ICD-10-CM

## 2018-11-23 DIAGNOSIS — Z79890 Hormone replacement therapy (postmenopausal): Secondary | ICD-10-CM

## 2018-11-23 DIAGNOSIS — Z7982 Long term (current) use of aspirin: Secondary | ICD-10-CM | POA: Diagnosis not present

## 2018-11-23 DIAGNOSIS — Z716 Tobacco abuse counseling: Secondary | ICD-10-CM | POA: Diagnosis not present

## 2018-11-23 DIAGNOSIS — M48061 Spinal stenosis, lumbar region without neurogenic claudication: Secondary | ICD-10-CM | POA: Diagnosis present

## 2018-11-23 DIAGNOSIS — S32030A Wedge compression fracture of third lumbar vertebra, initial encounter for closed fracture: Principal | ICD-10-CM | POA: Diagnosis present

## 2018-11-23 DIAGNOSIS — M4850XA Collapsed vertebra, not elsewhere classified, site unspecified, initial encounter for fracture: Secondary | ICD-10-CM | POA: Diagnosis present

## 2018-11-23 DIAGNOSIS — Z7951 Long term (current) use of inhaled steroids: Secondary | ICD-10-CM

## 2018-11-23 DIAGNOSIS — I119 Hypertensive heart disease without heart failure: Secondary | ICD-10-CM | POA: Diagnosis present

## 2018-11-23 DIAGNOSIS — Z886 Allergy status to analgesic agent status: Secondary | ICD-10-CM | POA: Diagnosis not present

## 2018-11-23 DIAGNOSIS — J449 Chronic obstructive pulmonary disease, unspecified: Secondary | ICD-10-CM | POA: Diagnosis present

## 2018-11-23 DIAGNOSIS — W010XXA Fall on same level from slipping, tripping and stumbling without subsequent striking against object, initial encounter: Secondary | ICD-10-CM | POA: Diagnosis present

## 2018-11-23 DIAGNOSIS — Z419 Encounter for procedure for purposes other than remedying health state, unspecified: Secondary | ICD-10-CM

## 2018-11-23 DIAGNOSIS — S34102A Unspecified injury to L2 level of lumbar spinal cord, initial encounter: Secondary | ICD-10-CM | POA: Diagnosis present

## 2018-11-23 LAB — CBC WITH DIFFERENTIAL/PLATELET
Abs Immature Granulocytes: 0.05 10*3/uL (ref 0.00–0.07)
Basophils Absolute: 0.1 10*3/uL (ref 0.0–0.1)
Basophils Relative: 0 %
Eosinophils Absolute: 0 10*3/uL (ref 0.0–0.5)
Eosinophils Relative: 0 %
HCT: 46.7 % — ABNORMAL HIGH (ref 36.0–46.0)
Hemoglobin: 15.5 g/dL — ABNORMAL HIGH (ref 12.0–15.0)
Immature Granulocytes: 0 %
Lymphocytes Relative: 11 %
Lymphs Abs: 1.2 10*3/uL (ref 0.7–4.0)
MCH: 33.1 pg (ref 26.0–34.0)
MCHC: 33.2 g/dL (ref 30.0–36.0)
MCV: 99.8 fL (ref 80.0–100.0)
Monocytes Absolute: 0.5 10*3/uL (ref 0.1–1.0)
Monocytes Relative: 4 %
Neutro Abs: 9.5 10*3/uL — ABNORMAL HIGH (ref 1.7–7.7)
Neutrophils Relative %: 85 %
Platelets: 227 10*3/uL (ref 150–400)
RBC: 4.68 MIL/uL (ref 3.87–5.11)
RDW: 13.4 % (ref 11.5–15.5)
WBC: 11.4 10*3/uL — ABNORMAL HIGH (ref 4.0–10.5)
nRBC: 0 % (ref 0.0–0.2)

## 2018-11-23 LAB — BASIC METABOLIC PANEL
Anion gap: 8 (ref 5–15)
BUN: 9 mg/dL (ref 8–23)
CO2: 23 mmol/L (ref 22–32)
Calcium: 8.9 mg/dL (ref 8.9–10.3)
Chloride: 109 mmol/L (ref 98–111)
Creatinine, Ser: 0.58 mg/dL (ref 0.44–1.00)
GFR calc Af Amer: >60 mL/min (ref >60)
GFR calc non Af Amer: >60 mL/min (ref >60)
Glucose, Bld: 109 mg/dL — ABNORMAL HIGH (ref 70–99)
Potassium: 3.7 mmol/L (ref 3.5–5.1)
Sodium: 140 mmol/L (ref 135–145)

## 2018-11-23 LAB — TROPONIN I: Troponin I: 0.04 ng/mL (ref <0.03)

## 2018-11-23 MED ORDER — ACETAMINOPHEN 325 MG PO TABS
650.0000 mg | ORAL_TABLET | Freq: Four times a day (QID) | ORAL | Status: DC | PRN
  Administered 2018-11-24 – 2018-11-26 (×2): 650 mg via ORAL
  Filled 2018-11-23 (×2): qty 2

## 2018-11-23 MED ORDER — SODIUM CHLORIDE 0.9 % IV SOLN: 250.0000 mL | INTRAVENOUS | Status: DC | PRN

## 2018-11-23 MED ORDER — METHOCARBAMOL 500 MG PO TABS
500.0000 mg | ORAL_TABLET | Freq: Three times a day (TID) | ORAL | Status: DC | PRN
  Filled 2018-11-23: qty 1

## 2018-11-23 MED ORDER — PANTOPRAZOLE SODIUM 20 MG PO TBEC
20.0000 mg | DELAYED_RELEASE_TABLET | Freq: Every day | ORAL | Status: DC
  Administered 2018-11-25 – 2018-11-26 (×2): 20 mg via ORAL
  Filled 2018-11-23 (×4): qty 1

## 2018-11-23 MED ORDER — ONDANSETRON HCL 4 MG PO TABS: 4.0000 mg | ORAL_TABLET | Freq: Four times a day (QID) | ORAL | Status: DC | PRN

## 2018-11-23 MED ORDER — METOPROLOL SUCCINATE ER 50 MG PO TB24
100.0000 mg | ORAL_TABLET | Freq: Every day | ORAL | Status: DC
  Administered 2018-11-24 – 2018-11-26 (×3): 100 mg via ORAL
  Filled 2018-11-23 (×4): qty 2

## 2018-11-23 MED ORDER — FLUTICASONE PROPIONATE 50 MCG/ACT NA SUSP
2.0000 | Freq: Every day | NASAL | Status: DC
  Administered 2018-11-24 – 2018-11-26 (×3): 2 via NASAL
  Filled 2018-11-23: qty 16

## 2018-11-23 MED ORDER — VITAMIN D3 25 MCG (1000 UNIT) PO TABS
1000.0000 [IU] | ORAL_TABLET | Freq: Every day | ORAL | Status: DC
  Administered 2018-11-24 – 2018-11-26 (×3): 1000 [IU] via ORAL
  Filled 2018-11-23 (×7): qty 1

## 2018-11-23 MED ORDER — MORPHINE SULFATE (PF) 4 MG/ML IV SOLN
4.0000 mg | Freq: Once | INTRAVENOUS | Status: AC
  Administered 2018-11-23: 13:00:00 4 mg via INTRAVENOUS
  Filled 2018-11-23: qty 1

## 2018-11-23 MED ORDER — MORPHINE SULFATE (PF) 4 MG/ML IV SOLN
4.0000 mg | Freq: Once | INTRAVENOUS | Status: AC
  Administered 2018-11-23: 16:00:00 4 mg via INTRAVENOUS
  Filled 2018-11-23: qty 1

## 2018-11-23 MED ORDER — TRAMADOL HCL 50 MG PO TABS
50.0000 mg | ORAL_TABLET | Freq: Once | ORAL | Status: AC
  Administered 2018-11-23: 50 mg via ORAL
  Filled 2018-11-23: qty 1

## 2018-11-23 MED ORDER — NICOTINE 21 MG/24HR TD PT24
21.0000 mg | MEDICATED_PATCH | Freq: Every day | TRANSDERMAL | Status: DC
  Administered 2018-11-25: 21 mg via TRANSDERMAL
  Filled 2018-11-23 (×2): qty 1

## 2018-11-23 MED ORDER — ALENDRONATE SODIUM 70 MG PO TABS: 70.0000 mg | ORAL_TABLET | ORAL | Status: DC

## 2018-11-23 MED ORDER — ACETAMINOPHEN 650 MG RE SUPP: 650.0000 mg | Freq: Four times a day (QID) | RECTAL | Status: DC | PRN

## 2018-11-23 MED ORDER — MORPHINE SULFATE (PF) 2 MG/ML IV SOLN
2.0000 mg | INTRAVENOUS | Status: DC | PRN
  Administered 2018-11-23 – 2018-11-25 (×2): 2 mg via INTRAVENOUS
  Filled 2018-11-23 (×2): qty 1

## 2018-11-23 MED ORDER — SODIUM CHLORIDE 0.9% FLUSH: 3.0000 mL | INTRAVENOUS | Status: DC | PRN

## 2018-11-23 MED ORDER — LEVOTHYROXINE SODIUM 50 MCG PO TABS
75.0000 ug | ORAL_TABLET | Freq: Every day | ORAL | Status: DC
  Administered 2018-11-24 – 2018-11-26 (×3): 75 ug via ORAL
  Filled 2018-11-23 (×3): qty 1

## 2018-11-23 MED ORDER — HYDROCODONE-ACETAMINOPHEN 5-325 MG PO TABS
1.0000 | ORAL_TABLET | ORAL | Status: DC | PRN
  Administered 2018-11-23: 1 via ORAL
  Administered 2018-11-24: 09:00:00 2 via ORAL
  Administered 2018-11-25 (×4): 1 via ORAL
  Administered 2018-11-26: 2 via ORAL
  Filled 2018-11-23 (×3): qty 1
  Filled 2018-11-23 (×2): qty 2
  Filled 2018-11-23: qty 1

## 2018-11-23 MED ORDER — LISINOPRIL 20 MG PO TABS
40.0000 mg | ORAL_TABLET | Freq: Every day | ORAL | Status: DC
  Administered 2018-11-24 – 2018-11-26 (×3): 40 mg via ORAL
  Filled 2018-11-23 (×3): qty 2

## 2018-11-23 MED ORDER — SENNOSIDES-DOCUSATE SODIUM 8.6-50 MG PO TABS
1.0000 | ORAL_TABLET | Freq: Every evening | ORAL | Status: DC | PRN
  Administered 2018-11-25: 22:00:00 1 via ORAL
  Filled 2018-11-23 (×2): qty 1

## 2018-11-23 MED ORDER — ONDANSETRON HCL 4 MG/2ML IJ SOLN: 4.0000 mg | Freq: Four times a day (QID) | INTRAMUSCULAR | Status: DC | PRN

## 2018-11-23 MED ORDER — SODIUM CHLORIDE 0.9% FLUSH
3.0000 mL | Freq: Two times a day (BID) | INTRAVENOUS | Status: DC
  Administered 2018-11-23 – 2018-11-25 (×5): 3 mL via INTRAVENOUS

## 2018-11-23 NOTE — ED Notes (Signed)
No rotation or shortening noted to right hip. Pulses present in both feet. Pt reports some tingling in her right leg.

## 2018-11-23 NOTE — Consult Note (Signed)
Spoke with Dr. Marisa Severin via phone.  History reviewed.  Patient has L3 compression fracture with some epidural hematoma but not neurological deficit.  I have reviewed the imaging.  A/P) L3 compression fracture with no concerning features for instability.  - Admit to medicine for pain control - Please order an LSO brace from the orthotics provider - Call Dr. Rosita Kea on Monday to consider kyphoplasty

## 2018-11-23 NOTE — Plan of Care (Signed)
Pt verbalizes understanding of wearing brace for back protection. Pt in pain but doesn't ask for pain med. Talked to pt about importance of letting staff know if she is in pain. Education on Hydrocodone for pain. Needs reinforcement on teaching.

## 2018-11-23 NOTE — Consult Note (Addendum)
ORTHOPAEDIC CONSULTATION  PATIENT NAME: Emma Mejia DOB: [birth year]  MRN: [id number]  REQUESTING PHYSICIAN: Adrian Saran, MD  Chief Complaint: Low back pain  HPI: Emma Mejia is a 73 y.o. female who slipped on a wet area and fell backwards onto her back and buttocks.  She had the onset of severe lower back pain.  She denied any loss of consciousness.  She denied any other injuries.  She denied any numbness or weakness of the lower extremities.  The patient had a history of previous vertebral compression fractures and is now 5 months status post T10 kyphoplasty performed by Dr. Cheri Fowler.  Past Medical History:  Diagnosis Date  . Brain aneurysm   . Hypertension   . Thyroid disease    Past Surgical History:  Procedure Laterality Date  . BRAIN SURGERY    . KYPHOPLASTY N/A 07/09/2018   Procedure: [id number];  Surgeon: Kennedy Bucker, MD;  Location: [facility] ORS;  Service: Orthopedics;  Laterality: N/A;  . spleen     Social History   Socioeconomic History  . Marital status: Divorced    Spouse name: Not on file  . Number of children: Not on file  . Years of education: Not on file  . Highest education level: Not on file  Occupational History  . Not on file  Social Needs  . Financial resource strain: Not on file  . Food insecurity:    Worry: Not on file    Inability: Not on file  . Transportation needs:    Medical: Not on file    Non-medical: Not on file  Tobacco Use  . Smoking status: Current Every Day Smoker  . Smokeless tobacco: Never Used  Substance and Sexual Activity  . Alcohol use: Not Currently  . Drug use: Not Currently  . Sexual activity: Not on file  Lifestyle  . Physical activity:    Days per week: Not on file    Minutes per session: Not on file  . Stress: Not on file  Relationships  . Social connections:    Talks on phone: Not on file    Gets together: Not on file    Attends religious service: Not on file    Active member of club or organization:  Not on file    Attends meetings of clubs or organizations: Not on file    Relationship status: Not on file  Other Topics Concern  . Not on file  Social History Narrative  . Not on file   History reviewed. No pertinent family history. Allergies  Allergen Reactions  . Ibuprofen   . Naproxen    Prior to Admission medications   Medication Sig Start Date End Date Taking? Authorizing Provider  acetaminophen (TYLENOL) 500 MG tablet Take 1,000 mg by mouth every 6 (six) hours as needed.   Yes [provider]  alendronate (FOSAMAX) 70 MG tablet Take 70 mg by mouth once a week. Take with a full glass of water on an empty stomach.   Yes [provider]  aspirin EC 81 MG tablet Take 81 mg by mouth daily.   Yes [provider]  cholecalciferol (VITAMIN D) 25 MCG (1000 UT) tablet Take 1,000 Units by mouth daily.   Yes [provider]  fluticasone (VERAMYST) 27.5 MCG/SPRAY nasal spray Place 2 sprays into the nose daily.   Yes [provider]  levothyroxine (SYNTHROID, LEVOTHROID) 75 MCG tablet Take 75 mcg by mouth daily before breakfast.   Yes [provider]  lisinopril (PRINIVIL,ZESTRIL) 20  MG tablet Take 40 mg by mouth daily.   Yes [provider]  metoprolol succinate (TOPROL-XL) 100 MG 24 hr tablet Take 100 mg by mouth daily.   Yes [provider]  pantoprazole (PROTONIX) 20 MG tablet Take 20 mg by mouth daily.   Yes [provider]  diclofenac sodium (VOLTAREN) 1 % GEL Apply 2 g topically 4 (four) times daily.    [provider]  latanoprost (XALATAN) 0.005 % ophthalmic solution Place 1 drop into both eyes Nightly. 10/16/18   [provider]  methocarbamol (ROBAXIN) 500 MG tablet Take 1 tablet (500 mg total) by mouth every 8 (eight) hours as needed for muscle spasms. Patient not taking: Reported on 09/03/2018 07/10/18   Delfino Lovett, MD   Ct Lumbar Spine Wo Contrast  Result Date: 11/23/2018 CLINICAL  DATA:  Back pain status post fall off the porch. Decreased sensation in the right leg EXAM: CT LUMBAR SPINE WITHOUT CONTRAST TECHNIQUE: Multidetector CT imaging of the lumbar spine was performed without intravenous contrast administration. Multiplanar CT image reconstructions were also generated. 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 Please refer to the enumerated sequence prior to any intervention. Alignment: Normal. Vertebrae: Chronic L4 vertebral body compression fracture with approximately 40% height loss. Chronic L5 vertebral body compression fracture with approximately 50% central height loss. Acute L3 vertebral body compression fracture with approximately 20% height loss and disruption of the posterior cortex. Remainder the vertebral body heights are maintained. Generalized osteopenia. No aggressive osseous lesion. Paraspinal and other soft tissues: No acute paraspinal abnormality. Abdominal aortic atherosclerosis. Disc levels: Disc spaces are maintained. T12-L1: No disc protrusion or foraminal stenosis. Mild bilateral facet arthropathy. L1-L2: No disc protrusion. Mild bilateral facet arthropathy. No foraminal stenosis. L2-L3: Mild broad-based disc bulge. Mild bilateral facet arthropathy. No foraminal stenosis. L3-L4: Minimal broad-based disc bulge. Mild bilateral facet arthropathy. No foraminal stenosis. L4-L5: Broad-based disc bulge. Moderate bilateral facet arthropathy. No foraminal stenosis. L5-S1: Broad-based disc bulge. Moderate bilateral facet arthropathy. Mild bilateral foraminal narrowing. IMPRESSION: 1. Acute mild burst compression fracture of the L3 vertebral body with approximately 20% height loss. No significant retropulsion of vertebral body. Electronically Signed   By: Elige Ko   On: 11/23/2018 12:02   Mr Lumbar Spine Wo Contrast  Result Date: 11/23/2018 CLINICAL DATA:  Status post fall, right  leg decreased sensation EXAM: MRI LUMBAR SPINE WITHOUT CONTRAST TECHNIQUE: Multiplanar, multisequence MR imaging of the lumbar spine was performed. No intravenous contrast was administered. 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 Please refer to the enumerated sequence prior to any intervention. Alignment:  Physiologic. Vertebrae:  Chronic L4 and L5 vertebral body compression fractures. Acute L3 vertebral body compression fracture with marrow edema throughout the vertebral body and approximately 20% height loss. Fracture involves the posterior cortex of the L3 vertebral body with 3 mm of retropulsion focally. Epidural hematoma emanating from the posterior fracture cleft and extending cephalad with the overall epidural hematoma measuring 9 x 13 x 32 mm and resulting in moderate-severe spinal stenosis at L2-3. No discitis or osteomyelitis.  No aggressive osseous lesion. Conus medullaris and cauda equina: Conus extends to the L1 level. Conus and cauda equina appear normal. Paraspinal and other soft tissues: Edema in the left psoas muscle with expansion likely reflecting muscle strain. Disc levels: Disc spaces: Degenerative disc disease with disc desiccation at L4-5 and L5-S1. T12-L1: Minimal broad-based  disc bulge. No evidence of neural foraminal stenosis. No central canal stenosis. L1-L2: No significant disc bulge. No evidence of neural foraminal stenosis. No central canal stenosis. L2-L3: No significant disc bulge. Epidural hemorrhage with moderate-severe spinal stenosis. No evidence of neural foraminal stenosis. L3-L4: Mild broad-based disc bulge. Small epidural hemorrhage and mild spinal stenosis. No evidence of neural foraminal stenosis. L4-L5: Minimal broad-based disc bulge. Moderate left foraminal narrowing. No right foraminal narrowing. No central canal stenosis. L5-S1: Mild broad-based disc bulge. Mild bilateral facet  arthropathy. Moderate left foraminal stenosis. No central canal stenosis. IMPRESSION: 1. Acute L3 vertebral body compression fracture with marrow edema throughout the vertebral body and approximately 20% height loss. Fracture involves the posterior cortex of the L3 vertebral body with 3 mm of retropulsion focally. Acute epidural hematoma emanating from the posterior fracture cleft and extending cephalad with the overall epidural hematoma measuring 9 x 13 x 32 mm and resulting in moderate-severe spinal stenosis at L2-3. Electronically Signed   By: Elige KoHetal  Patel   On: 11/23/2018 15:13   Dg Chest Portable 1 View  Result Date: 11/23/2018 CLINICAL DATA:  Pain after fall EXAM: PORTABLE CHEST 1 VIEW COMPARISON:  None. FINDINGS: Cardiomegaly. Previous vertebroplasty of a lower thoracic vertebral body. Mild atelectasis in the lateral left lung base. The hila and mediastinum are normal. No other acute abnormalities. IMPRESSION: No active disease. Electronically Signed   By: Gerome Samavid  Williams III M.D   On: 11/23/2018 12:23   Dg Hip Unilat W Or Wo Pelvis 2-3 Views Right  Result Date: 11/23/2018 CLINICAL DATA:  Pain after fall. EXAM: DG HIP (WITH OR WITHOUT PELVIS) 2-3V RIGHT COMPARISON:  None. FINDINGS: There is no evidence of hip fracture or dislocation. There is no evidence of arthropathy or other focal bone abnormality. IMPRESSION: Negative. Electronically Signed   By: Gerome Samavid  Williams III M.D   On: 11/23/2018 12:23    Positive ROS: All other systems have been reviewed and were otherwise negative with the exception of those mentioned in the HPI and as above.  Physical Exam: General: Well developed, well nourished frail-appearing female seen in obvious discomfort. HEENT: Atraumatic and normocephalic. Sclera are clear. Extraocular motion is intact. Oropharynx is clear with moist mucosa. Neck: Supple, nontender, good range of motion. No JVD or carotid bruits. Lungs: Clear to auscultation  bilaterally. Cardiovascular: Regular rate and rhythm with normal S1 and S2. 2-3/6 murmur. No gallops or rubs. Pedal pulses are palpable bilaterally. Homans test is negative bilaterally. No significant pretibial or ankle edema. Abdomen: Soft, nontender, and nondistended. Bowel sounds are present. Skin: No lesions in the area of chief complaint Neurologic: Awake, alert, and oriented. Sensory function is grossly intact to pinprick and light touch. Motor strength is somewhat difficult to determine due to guarding, but there does not appear to be any gross weakness. No clonus or tremor. Good motor coordination. Lymphatic: No axillary or cervical lymphadenopathy  MUSCULOSKELETAL: A lumbosacral orthotic is in place.  However, the patient was unable to sit due to severe lower back pain.  There was tenderness to palpation on the lower lumbar region.  Moderate lumbar spasm was present.  Straight leg raise was negative bilaterally.  Assessment: Acute L3 compression fracture  Plan: The findings were discussed with the patient.  She was admitted for pain control.  We will see how she does with the lumbosacral orthosis. I will contact Dr. Cheri FowlerMike Menz for consideration of kyphoplasty.  Kary Sugrue P. Angie FavaHooten, Jr. M.D.

## 2018-11-23 NOTE — Progress Notes (Signed)
Advanced care plan. Purpose of the Encounter: CODE STATUS Parties in Attendance: Patient Patient's Decision Capacity: Okay Subjective/Patient's story:  Emma Mejia  is a 73 y.o. female with a known history of hypertension, brain aneurysm, thyroid disease presented to emergency room with lower back pain secondary to fall.  Patient slipped on a rainy wet surface and fell from a standing height.  Did not hit her head but landed on her hips and back Objective/Medical story Patient was worked up with CT and MRI lumbar spine which showed L3 vertebral fracture along with epidural hematoma.  Case was discussed with neurosurgery no acute intervention.  Will need kyphoplasty and control of lumbar pain Goals of care determination:  Advance care directives goals of care and treatment plan discussed.  Patient wants everything done which includes CPR, intubation ventilator if the need arises. CODE STATUS: Full code Time spent discussing advanced care planning: 16 minutes

## 2018-11-23 NOTE — ED Provider Notes (Signed)
[facility] Emergency Department Provider Note ____________________________________________   First MD Initiated Contact with Patient 11/23/18 1116     (approximate)  I have reviewed the triage vital signs and the nursing notes.   HISTORY  Chief Complaint Fall and Hip Pain    HPI Emma Mejia is a 73 y.o. female with PMH as noted below as well as a history of COPD who presents with lower back and hip pain after a fall, acute onset this morning.  The patient states that she slipped on a rainy wet surface and fell from standing height.  She denies hitting her head.  She states that she mainly has pain in her lower back just above her hips, and somewhat in her right hip as well.  She has no weakness or numbness.  In addition, the patient reports a mild cough in the last few days.  She has no chest pain, shortness of breath, or fever.  Past Medical History:  Diagnosis Date  . Brain aneurysm   . Hypertension   . Thyroid disease     Patient Active Problem List   Diagnosis Date Noted  . Vertebral compression fracture (HCC) 11/23/2018  . Compression fracture of body of thoracic vertebra (HCC) 07/07/2018    Past Surgical History:  Procedure Laterality Date  . BRAIN SURGERY    . KYPHOPLASTY N/A 07/09/2018   Procedure: [id number];  Surgeon: Kennedy Bucker, MD;  Location: [facility] ORS;  Service: Orthopedics;  Laterality: N/A;  . spleen      Prior to Admission medications   Medication Sig Start Date End Date Taking? Authorizing Provider  alendronate (FOSAMAX) 70 MG tablet Take 70 mg by mouth once a week. Take with a full glass of water on an empty stomach.    [provider]  aspirin EC 81 MG tablet Take 81 mg by mouth daily.    [provider]  cholecalciferol (VITAMIN D) 25 MCG (1000 UT) tablet Take 1,000 Units by mouth daily.    [provider]  diclofenac sodium (VOLTAREN) 1 % GEL Apply 2 g topically 4 (four) times daily.     [provider]  fluticasone (VERAMYST) 27.5 MCG/SPRAY nasal spray Place 2 sprays into the nose daily.    [provider]  levothyroxine (SYNTHROID, LEVOTHROID) 75 MCG tablet Take 75 mcg by mouth daily before breakfast.    [provider]  lisinopril (PRINIVIL,ZESTRIL) 20 MG tablet Take 40 mg by mouth daily.    [provider]  methocarbamol (ROBAXIN) 500 MG tablet Take 1 tablet (500 mg total) by mouth every 8 (eight) hours as needed for muscle spasms. Patient not taking: Reported on 09/03/2018 07/10/18   Delfino Lovett, MD  metoprolol succinate (TOPROL-XL) 100 MG 24 hr tablet Take 100 mg by mouth daily.    [provider]  pantoprazole (PROTONIX) 20 MG tablet Take 20 mg by mouth daily.    [provider]    Allergies Ibuprofen and Naproxen  History reviewed. No pertinent family history.  Social History Social History   Tobacco Use  . Smoking status: Current Every Day Smoker  . Smokeless tobacco: Never Used  Substance Use Topics  . Alcohol use: Not Currently  . Drug use: Not Currently    Review of Systems  Constitutional: No fever. Eyes: No redness. ENT: No sore throat.  No neck pain. Cardiovascular: Denies chest pain. Respiratory: Denies shortness of breath. Gastrointestinal: No vomiting or diarrhea.  Genitourinary: Negative for flank pain. Musculoskeletal: Positive for  back pain. Skin: Negative for rash. Neurological: Negative for focal weakness or numbness.   ____________________________________________   PHYSICAL EXAM:  VITAL SIGNS: ED Triage Vitals  Enc Vitals Group     BP 11/23/18 1110 (!) 181/90     Pulse Rate 11/23/18 1111 84     Resp 11/23/18 1111 18     Temp 11/23/18 1111 98 F (36.7 C)     Temp Source 11/23/18 1111 Oral     SpO2 11/23/18 1111 96 %     Weight 11/23/18 1112 119 lb 0.8 oz (54 kg)     Height 11/23/18 1112 5\' 5"  (1.651 m)     Head Circumference --      Peak Flow --      Pain Score  11/23/18 1111 10     Pain Loc --      Pain Edu? --      Excl. in GC? --     Constitutional: Alert and oriented.  Relatively well appearing and in no acute distress. Eyes: Conjunctivae are normal.  Head: Atraumatic. Nose: No congestion/rhinnorhea. Mouth/Throat: Mucous membranes are moist.   Neck: Normal range of motion.  Cardiovascular: Normal rate, regular rhythm. Good peripheral circulation. Respiratory: Normal respiratory effort.  No retractions. Lungs CTAB. Gastrointestinal: No distention.  Genitourinary: No CVA tenderness. Musculoskeletal: No lower extremity edema.  Extremities warm and well perfused.  Bilateral lumbar paraspinal tenderness.  No significant midline tenderness.  No deformity or shortening of the hips.  Full range of motion of bilateral hips with some pain on the right. Neurologic:  Normal speech and language.  5/5 motor strength and intact sensation of bilateral lower extremities.  No gross focal neurologic deficits are appreciated.  Skin:  Skin is warm and dry. No rash noted. Psychiatric: Mood and affect are normal. Speech and behavior are normal.  ____________________________________________   LABS (all labs ordered are listed, but only abnormal results are displayed)  Labs Reviewed  BASIC METABOLIC PANEL - Abnormal; Notable for the following components:      Result Value   Glucose, Bld 109 (*)    All other components within normal limits  CBC WITH DIFFERENTIAL/PLATELET - Abnormal; Notable for the following components:   WBC 11.4 (*)    Hemoglobin 15.5 (*)    HCT 46.7 (*)    Neutro Abs 9.5 (*)    All other components within normal limits  TROPONIN I - Abnormal; Notable for the following components:   Troponin I 0.04 (*)    All other components within normal limits   ____________________________________________  EKG  ED ECG REPORT I, Dionne BucySebastian Yishai Rehfeld, the attending physician, personally viewed and interpreted this ECG.  Date: 11/23/2018 EKG  Time: 1119 Rate: 84 Rhythm: normal sinus rhythm QRS Axis: normal Intervals: Prolonged QT ST/T Wave abnormalities: LVH with repolarization abnormality and T wave inversions in V2 through V5 Narrative Interpretation: New anterolateral T wave inversions when compared to EKG of 07/07/2018  ____________________________________________  RADIOLOGY  CXR: No focal infiltrate or other acute abnormality XR R hip/pelvis: No acute fracture CT lumbar: L3 compression fracture MR lumbar: L3 compression fracture with epidural hematoma  ____________________________________________   PROCEDURES  Procedure(s) performed: No  Procedures  Critical Care performed: No ____________________________________________   INITIAL IMPRESSION / ASSESSMENT AND PLAN / ED COURSE  Pertinent labs & imaging results that were available during my care of the patient were reviewed by me and considered in my medical decision making (see chart for details).  73 year old female with PMH as noted  above presents with lower back and right hip pain after a mechanical fall from standing height.  She she had no head injury or LOC.  The patient also reports some cough over the last few days.  I reviewed the past medical records in epic.  The patient was admitted in December 2019 with COPD exacerbation as well as a compression fracture in a thoracic vertebra requiring kyphoplasty.  On exam today, the patient is relatively well-appearing.  Her vital signs are normal except for elevated blood pressure.  She has no midline spinal tenderness, but does have some bilateral paraspinal tenderness in the lumbar area although the exam is somewhat limited by the patient's inability to fully turn or sit due to pain.  She has good range of motion to bilateral hips.  Overall I suspect most likely contusion, however given the patient's age we will obtain x-ray of the right hip and pelvis, CT of the lumbar spine, and a chest x-ray to evaluate the  cough.  ----------------------------------------- 4:10 PM on 11/23/2018 -----------------------------------------  CT lumbar spine reveals an L3 compression fracture.  On reassessment the patient has no lower extremity weakness and is able to lift both legs and flex both hips without significant difficulty.  I consulted Dr. Ernest Pine from orthopedics.  He recommended an MRI to further evaluate, as the patient may need kyphoplasty.  He advised that he will consult with the patient requires admission.  The MRI was obtained and confirms L3 compression fracture but also reveals an epidural hematoma with some spinal stenosis.  I contacted the [facility] transfer center and consulted Dr. Marcell Barlow from neurosurgery who is on-call.  He looked at the images and advised that if the patient has no neurologic deficits she likely would not require neurosurgical intervention, but will likely need a kyphoplasty.  He advised that the patient did not need transfer to [facility] or emergent neurosurgical intervention at this time.  I informed the patient about the results of the work-up.  I signed her out to the hospitalist Dr. Tobi Bastos for admission.  ____________________________________________   FINAL CLINICAL IMPRESSION(S) / ED DIAGNOSES  Final diagnoses:  Compression fracture of L3 vertebra, initial encounter (HCC)      NEW MEDICATIONS STARTED DURING THIS VISIT:  New Prescriptions   No medications on file     Note:  This document was prepared using Dragon voice recognition software and may include unintentional dictation errors.    Dionne Bucy, MD 11/23/18 [phone]

## 2018-11-23 NOTE — ED Notes (Signed)
Called [facility] for consult to neurosurgery  [phone]

## 2018-11-23 NOTE — ED Notes (Signed)
Patient transported to X-ray 

## 2018-11-23 NOTE — ED Notes (Signed)
ED TO INPATIENT HANDOFF REPORT  ED Nurse Name and Phone #: Shanda Bumps [phone]  S Name/Age/Gender Emma Mejia 73 y.o. female Room/Bed: ED25A/ED25A  Code Status   Code Status: Prior  Home/SNF/Other Home Patient oriented to: self, place, time and situation Is this baseline? Yes   Triage Complete: Triage complete  Chief Complaint Fall   Triage Note Pt comes from home via EMS after a fall off her back porch this am. Pt reports pain and decreased sensation to right leg after fall. Pt took  tylenol after her fall this am. Pt was hypertensive with EMS at 196/102. Pt recently had back surgery and c/o lower back pain.    Allergies Allergies  Allergen Reactions  . Ibuprofen   . Naproxen     Level of Care/Admitting Diagnosis ED Disposition    ED Disposition Condition Comment   Admit  Hospital Area: [facility] [[id number]]  Level of Care: Med-Surg [16]  Covid Evaluation: N/A  Diagnosis: Vertebral compression fracture [facility]) [[id number]]  Admitting Physician: Ihor Austin [[id number]]  Attending Physician: Ihor Austin [[id number]]  Estimated length of stay: past midnight tomorrow  Certification:: I certify this patient will need inpatient services for at least 2 midnights  PT Class (Do Not Modify): Inpatient [101]  PT Acc Code (Do Not Modify): Private [1]       B Medical/Surgery History Past Medical History:  Diagnosis Date  . Brain aneurysm   . Hypertension   . Thyroid disease    Past Surgical History:  Procedure Laterality Date  . BRAIN SURGERY    . KYPHOPLASTY N/A 07/09/2018   Procedure: [id number];  Surgeon: Kennedy Bucker, MD;  Location: [facility] ORS;  Service: Orthopedics;  Laterality: N/A;  . spleen       A IV Location/Drains/Wounds Patient Lines/Drains/Airways Status   Active Line/Drains/Airways    Name:   Placement date:   Placement time:   Site:   Days:   Peripheral IV 11/23/18 Left Hand   11/23/18    1211    Hand   less than 1    Incision (Closed) 07/09/18 Back   07/09/18    1310     137          Intake/Output Last 24 hours No intake or output data in the 24 hours ending 11/23/18 1624  Labs/Imaging Results for orders placed or performed during the hospital encounter of 11/23/18 (from the past 48 hour(s))  Basic metabolic panel     Status: Abnormal   Collection Time: 11/23/18 12:08 PM  Result Value Ref Range   Sodium 140 135 - 145 mmol/L   Potassium 3.7 3.5 - 5.1 mmol/L   Chloride 109 98 - 111 mmol/L   CO2 23 22 - 32 mmol/L   Glucose, Bld 109 (H) 70 - 99 mg/dL   BUN 9 8 - 23 mg/dL   Creatinine, Ser 6.29 0.44 - 1.00 mg/dL   Calcium 8.9 8.9 - 52.8 mg/dL   GFR calc non Af Amer >60 >60 mL/min   GFR calc Af Amer >60 >60 mL/min   Anion gap 8 5 - 15    Comment: Performed at [facility], [address] [place], [place] [address]  CBC with Differential     Status: Abnormal   Collection Time: 11/23/18 12:08 PM  Result Value Ref Range   WBC 11.4 (H) 4.0 - 10.5 K/uL   RBC 4.68 3.87 - 5.11 MIL/uL   Hemoglobin 15.5 (H) 12.0 - 15.0 g/dL   HCT 40.1 (  H) 36.0 - 46.0 %   MCV 99.8 80.0 - 100.0 fL   MCH 33.1 26.0 - 34.0 pg   MCHC 33.2 30.0 - 36.0 g/dL   RDW 04.5 40.9 - 81.1 %   Platelets 227 150 - 400 K/uL   nRBC 0.0 0.0 - 0.2 %   Neutrophils Relative % 85 %   Neutro Abs 9.5 (H) 1.7 - 7.7 K/uL   Lymphocytes Relative 11 %   Lymphs Abs 1.2 0.7 - 4.0 K/uL   Monocytes Relative 4 %   Monocytes Absolute 0.5 0.1 - 1.0 K/uL   Eosinophils Relative 0 %   Eosinophils Absolute 0.0 0.0 - 0.5 K/uL   Basophils Relative 0 %   Basophils Absolute 0.1 0.0 - 0.1 K/uL   Immature Granulocytes 0 %   Abs Immature Granulocytes 0.05 0.00 - 0.07 K/uL    Comment: Performed at [facility], [address] [place], [place] [address]  Troponin I - Once     Status: Abnormal   Collection Time: 11/23/18 12:08 PM  Result Value Ref Range   Troponin I 0.04 (HH) <0.03 ng/mL    Comment: CRITICAL RESULT CALLED TO, READ BACK  BY AND VERIFIED WITH JESSICA Jolynn Bajorek  11/23/18 AKT Performed at [facility], [address], [place], [place] [address]    Ct Lumbar Spine Wo Contrast  Result Date: 11/23/2018 CLINICAL DATA:  Back pain status post fall off the porch. Decreased sensation in the right leg EXAM: CT LUMBAR SPINE WITHOUT CONTRAST TECHNIQUE: Multidetector CT imaging of the lumbar spine was performed without intravenous contrast administration. Multiplanar CT image reconstructions were also generated. 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 Please refer to the enumerated sequence prior to any intervention. Alignment: Normal. Vertebrae: Chronic L4 vertebral body compression fracture with approximately 40% height loss. Chronic L5 vertebral body compression fracture with approximately 50% central height loss. Acute L3 vertebral body compression fracture with approximately 20% height loss and disruption of the posterior cortex. Remainder the vertebral body heights are maintained. Generalized osteopenia. No aggressive osseous lesion. Paraspinal and other soft tissues: No acute paraspinal abnormality. Abdominal aortic atherosclerosis. Disc levels: Disc spaces are maintained. T12-L1: No disc protrusion or foraminal stenosis. Mild bilateral facet arthropathy. L1-L2: No disc protrusion. Mild bilateral facet arthropathy. No foraminal stenosis. L2-L3: Mild broad-based disc bulge. Mild bilateral facet arthropathy. No foraminal stenosis. L3-L4: Minimal broad-based disc bulge. Mild bilateral facet arthropathy. No foraminal stenosis. L4-L5: Broad-based disc bulge. Moderate bilateral facet arthropathy. No foraminal stenosis. L5-S1: Broad-based disc bulge. Moderate bilateral facet arthropathy. Mild bilateral foraminal narrowing. IMPRESSION: 1. Acute mild burst compression fracture of the L3 vertebral body with approximately 20% height loss. No  significant retropulsion of vertebral body. Electronically Signed   By: Elige Ko   On: 11/23/2018 12:02   Mr Lumbar Spine Wo Contrast  Result Date: 11/23/2018 CLINICAL DATA:  Status post fall, right leg decreased sensation EXAM: MRI LUMBAR SPINE WITHOUT CONTRAST TECHNIQUE: Multiplanar, multisequence MR imaging of the lumbar spine was performed. No intravenous contrast was administered. 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 Please refer to the enumerated sequence prior to any intervention. Alignment:  Physiologic. Vertebrae:  Chronic L4 and L5 vertebral body compression fractures. Acute L3 vertebral body compression fracture with marrow edema throughout the vertebral body and approximately 20% height loss. Fracture involves the posterior cortex of the L3 vertebral body with  3 mm of retropulsion focally. Epidural hematoma emanating from the posterior fracture cleft and extending cephalad with the overall epidural hematoma measuring 9 x 13 x 32 mm and resulting in moderate-severe spinal stenosis at L2-3. No discitis or osteomyelitis.  No aggressive osseous lesion. Conus medullaris and cauda equina: Conus extends to the L1 level. Conus and cauda equina appear normal. Paraspinal and other soft tissues: Edema in the left psoas muscle with expansion likely reflecting muscle strain. Disc levels: Disc spaces: Degenerative disc disease with disc desiccation at L4-5 and L5-S1. T12-L1: Minimal broad-based disc bulge. No evidence of neural foraminal stenosis. No central canal stenosis. L1-L2: No significant disc bulge. No evidence of neural foraminal stenosis. No central canal stenosis. L2-L3: No significant disc bulge. Epidural hemorrhage with moderate-severe spinal stenosis. No evidence of neural foraminal stenosis. L3-L4: Mild broad-based disc bulge. Small epidural hemorrhage and mild spinal stenosis. No evidence of neural  foraminal stenosis. L4-L5: Minimal broad-based disc bulge. Moderate left foraminal narrowing. No right foraminal narrowing. No central canal stenosis. L5-S1: Mild broad-based disc bulge. Mild bilateral facet arthropathy. Moderate left foraminal stenosis. No central canal stenosis. IMPRESSION: 1. Acute L3 vertebral body compression fracture with marrow edema throughout the vertebral body and approximately 20% height loss. Fracture involves the posterior cortex of the L3 vertebral body with 3 mm of retropulsion focally. Acute epidural hematoma emanating from the posterior fracture cleft and extending cephalad with the overall epidural hematoma measuring 9 x 13 x 32 mm and resulting in moderate-severe spinal stenosis at L2-3. Electronically Signed   By: Elige Ko   On: 11/23/2018 15:13   Dg Chest Portable 1 View  Result Date: 11/23/2018 CLINICAL DATA:  Pain after fall EXAM: PORTABLE CHEST 1 VIEW COMPARISON:  None. FINDINGS: Cardiomegaly. Previous vertebroplasty of a lower thoracic vertebral body. Mild atelectasis in the lateral left lung base. The hila and mediastinum are normal. No other acute abnormalities. IMPRESSION: No active disease. Electronically Signed   By: Gerome Sam III M.D   On: 11/23/2018 12:23   Dg Hip Unilat W Or Wo Pelvis 2-3 Views Right  Result Date: 11/23/2018 CLINICAL DATA:  Pain after fall. EXAM: DG HIP (WITH OR WITHOUT PELVIS) 2-3V RIGHT COMPARISON:  None. FINDINGS: There is no evidence of hip fracture or dislocation. There is no evidence of arthropathy or other focal bone abnormality. IMPRESSION: Negative. 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,   R     Signed and Held   Signed and Held  CBC  Tomorrow morning,   R     Signed and Held          Vitals/Pain Today's Vitals   11/23/18 1400 11/23/18  1426 11/23/18 1534 11/23/18 1537  BP: (!) 157/113  (!) 164/87   Pulse: 91  93   Resp: 16  14   Temp:      TempSrc:      SpO2: 94%  93%   Weight:      Height:      PainSc:  9   10-Worst pain ever    Isolation Precautions No active isolations  Medications Medications  traMADol (ULTRAM) tablet 50 mg (50 mg Oral Given 11/23/18 1133)  morphine 4 MG/ML injection 4 mg (4 mg Intravenous Given 11/23/18 1315)  morphine 4  MG/ML injection 4 mg (4 mg Intravenous Given 11/23/18 1537)    Mobility non-ambulatory Low fall risk   Focused Assessments Musculoskeletal: pt has compression fracture to L3 and c/o back pain.   R Recommendations: See Admitting Provider Note  Report given to:   Additional Notes:

## 2018-11-23 NOTE — Plan of Care (Signed)
  Problem: Health Behavior/Discharge Planning: Goal: Ability to manage health-related needs will improve Outcome: Progressing   Problem: Clinical Measurements: Goal: Ability to maintain clinical measurements within normal limits will improve Outcome: Progressing Goal: Will remain free from infection Outcome: Progressing   Problem: Activity: Goal: Risk for activity intolerance will decrease Outcome: Progressing   Problem: Nutrition: Goal: Adequate nutrition will be maintained Outcome: Progressing   Problem: Coping: Goal: Level of anxiety will decrease Outcome: Progressing   Problem: Elimination: Goal: Will not experience complications related to bowel motility Outcome: Progressing Goal: Will not experience complications related to urinary retention Outcome: Progressing   Problem: Pain Managment: Goal: General experience of comfort will improve Outcome: Progressing   Problem: Safety: Goal: Ability to remain free from injury will improve Outcome: Progressing   Problem: Skin Integrity: Goal: Risk for impaired skin integrity will decrease Outcome: Progressing   

## 2018-11-23 NOTE — ED Notes (Signed)
Patient transported to MRI 

## 2018-11-23 NOTE — H&P (Addendum)
[facility] [place] at [facility]   PATIENT NAME: Emma Mejia    MR#:  [id number]  DATE OF BIRTH:  May 13, 1946  DATE OF ADMISSION:  11/23/2018  PRIMARY CARE PHYSICIAN: Bertram Savin, MD   REQUESTING/REFERRING PHYSICIAN:   CHIEF COMPLAINT:   Chief Complaint  Patient presents with  . Fall  . Hip Pain    HISTORY OF PRESENT ILLNESS: Emma Mejia  is a 73 y.o. female with a known history of hypertension, brain aneurysm, thyroid disease presented to emergency room with lower back pain secondary to fall.  Patient slipped on a rainy wet surface and fell from a standing height.  Did not hit her head but landed on her hips and back.  Patient has pain in the lower back.  No complaints of any weakness in the lower extremities or numbness.  No bowel bladder incontinence.  No complaints of any shortness of breath, fever, sore throat.  Hip imaging studies did not reveal any fracture.  She was worked up with CT lumbar spine and thoracic lumbar spine which show acute L3 vertebral compression fracture and epidural hematoma.  Case was discussed with neurosurgeon Marcell Barlow by ER physician who recommended TSL O brace but no acute intervention.  Plan for vertebral kyphoplasty by orthopedic service by Dr. Rosita Kea on Monday.  PAST MEDICAL HISTORY:   Past Medical History:  Diagnosis Date  . Brain aneurysm   . Hypertension   . Thyroid disease     PAST SURGICAL HISTORY:  Past Surgical History:  Procedure Laterality Date  . BRAIN SURGERY    . KYPHOPLASTY N/A 07/09/2018   Procedure: [id number];  Surgeon: Kennedy Bucker, MD;  Location: [facility] ORS;  Service: Orthopedics;  Laterality: N/A;  . spleen      SOCIAL HISTORY:  Social History   Tobacco Use  . Smoking status: Current Every Day Smoker  . Smokeless tobacco: Never Used  Substance Use Topics  . Alcohol use: Not Currently    FAMILY HISTORY: History reviewed. No pertinent family history.  DRUG ALLERGIES:   Allergies  Allergen Reactions  . Ibuprofen   . Naproxen     REVIEW OF SYSTEMS:   CONSTITUTIONAL: No fever, fatigue or weakness.  EYES: No blurred or double vision.  EARS, NOSE, AND THROAT: No tinnitus or ear pain.  RESPIRATORY: No cough, shortness of breath, wheezing or hemoptysis.  CARDIOVASCULAR: No chest pain, orthopnea, edema.  GASTROINTESTINAL: No nausea, vomiting, diarrhea or abdominal pain.  GENITOURINARY: No dysuria, hematuria.  ENDOCRINE: No polyuria, nocturia,  HEMATOLOGY: No anemia, easy bruising or bleeding SKIN: No rash or lesion. MUSCULOSKELETAL: Has back pain NEUROLOGIC: No tingling, numbness, weakness.  PSYCHIATRY: No anxiety or depression.   MEDICATIONS AT HOME:  Prior to Admission medications   Medication Sig Start Date End Date Taking? Authorizing Provider  alendronate (FOSAMAX) 70 MG tablet Take 70 mg by mouth once a week. Take with a full glass of water on an empty stomach.    [provider]  aspirin EC 81 MG tablet Take 81 mg by mouth daily.    [provider]  cholecalciferol (VITAMIN D) 25 MCG (1000 UT) tablet Take 1,000 Units by mouth daily.    [provider]  diclofenac sodium (VOLTAREN) 1 % GEL Apply 2 g topically 4 (four) times daily.    [provider]  fluticasone (VERAMYST) 27.5 MCG/SPRAY nasal spray Place 2 sprays into the nose daily.    [provider]  levothyroxine (SYNTHROID, LEVOTHROID) 75 MCG tablet  Take 75 mcg by mouth daily before breakfast.    [provider]  lisinopril (PRINIVIL,ZESTRIL) 20 MG tablet Take 40 mg by mouth daily.    [provider]  methocarbamol (ROBAXIN) 500 MG tablet Take 1 tablet (500 mg total) by mouth every 8 (eight) hours as needed for muscle spasms. Patient not taking: Reported on 09/03/2018 07/10/18   Delfino Lovett, MD  metoprolol succinate (TOPROL-XL) 100 MG 24 hr tablet Take 100 mg by mouth daily.    [provider]  pantoprazole (PROTONIX) 20  MG tablet Take 20 mg by mouth daily.    [provider]      PHYSICAL EXAMINATION:   VITAL SIGNS: Blood pressure (!) 164/87, pulse 93, temperature 98 F (36.7 C), temperature source Oral, resp. rate 14, height 5\' 5"  (1.651 m), weight 54 kg, SpO2 93 %.  GENERAL:  73 y.o.-year-old patient lying in the bed with no acute distress.  EYES: Pupils equal, round, reactive to light and accommodation. No scleral icterus. Extraocular muscles intact.  HEENT: Head atraumatic, normocephalic. Oropharynx and nasopharynx clear.  NECK:  Supple, no jugular venous distention. No thyroid enlargement, no tenderness.  LUNGS: Normal breath sounds bilaterally, no wheezing, rales,rhonchi or crepitation. No use of accessory muscles of respiration.  CARDIOVASCULAR: S1, S2 normal. No murmurs, rubs, or gallops.  ABDOMEN: Soft, nontender, nondistended. Bowel sounds present. No organomegaly or mass.  EXTREMITIES: No pedal edema, cyanosis, or clubbing.  Tenderness in the lumbo sacral area NEUROLOGIC: Cranial nerves II through XII are intact. Muscle strength 5/5 in all extremities. Sensation intact. Gait not checked.  PSYCHIATRIC: The patient is alert and oriented x 3.  SKIN: No obvious rash, lesion, or ulcer.   LABORATORY PANEL:   CBC Recent Labs  Lab 11/23/18 1208  WBC 11.4*  HGB 15.5*  HCT 46.7*  PLT 227  MCV 99.8  MCH 33.1  MCHC 33.2  RDW 13.4  LYMPHSABS 1.2  MONOABS 0.5  EOSABS 0.0  BASOSABS 0.1   ------------------------------------------------------------------------------------------------------------------  Chemistries  Recent Labs  Lab 11/23/18 1208  NA 140  K 3.7  CL 109  CO2 23  GLUCOSE 109*  BUN 9  CREATININE 0.58  CALCIUM 8.9   ------------------------------------------------------------------------------------------------------------------ estimated creatinine clearance is 53.4 mL/min (by C-G formula based on SCr of 0.58  mg/dL). ------------------------------------------------------------------------------------------------------------------ No results for input(s): TSH, T4TOTAL, T3FREE, THYROIDAB in the last 72 hours.  Invalid input(s): FREET3   Coagulation profile No results for input(s): INR, PROTIME in the last 168 hours. ------------------------------------------------------------------------------------------------------------------- No results for input(s): DDIMER in the last 72 hours. -------------------------------------------------------------------------------------------------------------------  Cardiac Enzymes Recent Labs  Lab 11/23/18 1208  TROPONINI 0.04*   ------------------------------------------------------------------------------------------------------------------ Invalid input(s): POCBNP  ---------------------------------------------------------------------------------------------------------------  Urinalysis No results found for: COLORURINE, APPEARANCEUR, LABSPEC, PHURINE, GLUCOSEU, HGBUR, BILIRUBINUR, KETONESUR, PROTEINUR, UROBILINOGEN, NITRITE, LEUKOCYTESUR   RADIOLOGY: Ct Lumbar Spine Wo Contrast  Result Date: 11/23/2018 CLINICAL DATA:  Back pain status post fall off the porch. Decreased sensation in the right leg EXAM: CT LUMBAR SPINE WITHOUT CONTRAST TECHNIQUE: Multidetector CT imaging of the lumbar spine was performed without intravenous contrast administration. Multiplanar CT image reconstructions were also generated. 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 Please refer to the enumerated sequence prior to any intervention. Alignment: Normal. Vertebrae: Chronic L4 vertebral body compression fracture with approximately 40% height loss. Chronic L5 vertebral body compression fracture with approximately 50% central height loss. Acute L3 vertebral body compression fracture with  approximately 20% height loss and disruption  of the posterior cortex. Remainder the vertebral body heights are maintained. Generalized osteopenia. No aggressive osseous lesion. Paraspinal and other soft tissues: No acute paraspinal abnormality. Abdominal aortic atherosclerosis. Disc levels: Disc spaces are maintained. T12-L1: No disc protrusion or foraminal stenosis. Mild bilateral facet arthropathy. L1-L2: No disc protrusion. Mild bilateral facet arthropathy. No foraminal stenosis. L2-L3: Mild broad-based disc bulge. Mild bilateral facet arthropathy. No foraminal stenosis. L3-L4: Minimal broad-based disc bulge. Mild bilateral facet arthropathy. No foraminal stenosis. L4-L5: Broad-based disc bulge. Moderate bilateral facet arthropathy. No foraminal stenosis. L5-S1: Broad-based disc bulge. Moderate bilateral facet arthropathy. Mild bilateral foraminal narrowing. IMPRESSION: 1. Acute mild burst compression fracture of the L3 vertebral body with approximately 20% height loss. No significant retropulsion of vertebral body. Electronically Signed   By: Elige KoHetal  Patel   On: 11/23/2018 12:02   Mr Lumbar Spine Wo Contrast  Result Date: 11/23/2018 CLINICAL DATA:  Status post fall, right leg decreased sensation EXAM: MRI LUMBAR SPINE WITHOUT CONTRAST TECHNIQUE: Multiplanar, multisequence MR imaging of the lumbar spine was performed. No intravenous contrast was administered. 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 Please refer to the enumerated sequence prior to any intervention. Alignment:  Physiologic. Vertebrae:  Chronic L4 and L5 vertebral body compression fractures. Acute L3 vertebral body compression fracture with marrow edema throughout the vertebral body and approximately 20% height loss. Fracture involves the posterior cortex of the L3 vertebral body with 3 mm of retropulsion focally. Epidural hematoma emanating from the  posterior fracture cleft and extending cephalad with the overall epidural hematoma measuring 9 x 13 x 32 mm and resulting in moderate-severe spinal stenosis at L2-3. No discitis or osteomyelitis.  No aggressive osseous lesion. Conus medullaris and cauda equina: Conus extends to the L1 level. Conus and cauda equina appear normal. Paraspinal and other soft tissues: Edema in the left psoas muscle with expansion likely reflecting muscle strain. Disc levels: Disc spaces: Degenerative disc disease with disc desiccation at L4-5 and L5-S1. T12-L1: Minimal broad-based disc bulge. No evidence of neural foraminal stenosis. No central canal stenosis. L1-L2: No significant disc bulge. No evidence of neural foraminal stenosis. No central canal stenosis. L2-L3: No significant disc bulge. Epidural hemorrhage with moderate-severe spinal stenosis. No evidence of neural foraminal stenosis. L3-L4: Mild broad-based disc bulge. Small epidural hemorrhage and mild spinal stenosis. No evidence of neural foraminal stenosis. L4-L5: Minimal broad-based disc bulge. Moderate left foraminal narrowing. No right foraminal narrowing. No central canal stenosis. L5-S1: Mild broad-based disc bulge. Mild bilateral facet arthropathy. Moderate left foraminal stenosis. No central canal stenosis. IMPRESSION: 1. Acute L3 vertebral body compression fracture with marrow edema throughout the vertebral body and approximately 20% height loss. Fracture involves the posterior cortex of the L3 vertebral body with 3 mm of retropulsion focally. Acute epidural hematoma emanating from the posterior fracture cleft and extending cephalad with the overall epidural hematoma measuring 9 x 13 x 32 mm and resulting in moderate-severe spinal stenosis at L2-3. Electronically Signed   By: Elige KoHetal  Patel   On: 11/23/2018 15:13   Dg Chest Portable 1 View  Result Date: 11/23/2018 CLINICAL DATA:  Pain after fall EXAM: PORTABLE CHEST 1 VIEW COMPARISON:  None. FINDINGS: Cardiomegaly.  Previous vertebroplasty of a lower thoracic vertebral body. Mild atelectasis in the lateral left lung base. The hila and mediastinum are normal. No other acute abnormalities. IMPRESSION: No active disease. Electronically Signed   By: Gerome Samavid  Williams III M.D   On: 11/23/2018 12:23  Dg Hip Unilat W Or Wo Pelvis 2-3 Views Right  Result Date: 11/23/2018 CLINICAL DATA:  Pain after fall. EXAM: DG HIP (WITH OR WITHOUT PELVIS) 2-3V RIGHT COMPARISON:  None. FINDINGS: There is no evidence of hip fracture or dislocation. There is no evidence of arthropathy or other focal bone abnormality. IMPRESSION: Negative. Electronically Signed   By: Gerome Sam III M.D   On: 11/23/2018 12:23    EKG: Orders placed or performed during the hospital encounter of 11/23/18  . EKG 12-Lead  . EKG 12-Lead    IMPRESSION AND PLAN: 73 year old female patient with a known history of hypertension, brain aneurysm, thyroid disease presented to emergency room with lower back pain secondary to fall.    -L3 vertebral fracture Secondary to mechanical trauma Orthopedic surgery service consult for kyphoplasty Pain management with oral narcotics and PRN IV morphine  -Acute epidural hematoma secondary to trauma and fall Neurosurgery Dr. Marcell Barlow consulted by ER No acute recommendation TSLO brace Clinical monitoring  -Hold blood thinner meds for now  -DVT prophylaxis sequential compression device to lower extremities  -Hypertension Continue ACE inhibitor for control of blood pressure  -Tobacco abuse Tobacco cessation counseled to the patient for 6 minutes Nicotine patch offered  All the records are reviewed and case discussed with ED provider. Management plans discussed with the patient, family and they are in agreement.  CODE STATUS:Full code Code Status History    Date Active Date Inactive Code Status Order ID Comments User Context   07/07/2018 1500 07/10/2018 1540 Full Code [id number]  Enedina Finner, MD Inpatient        TOTAL TIME TAKING CARE OF THIS PATIENT: 55 minutes.    Ihor Austin M.D on 11/23/2018 at 4:44 PM  Between 7am to 6pm - Pager - [phone]  After 6pm go to [email or web address] - password [email or web address] [facility]  [facility] [place]  Office  [phone]  CC: Primary care physician; Bertram Savin, MD

## 2018-11-23 NOTE — ED Triage Notes (Signed)
Pt comes from home via EMS after a fall off her back porch this am. Pt reports pain and decreased sensation to right leg after fall. Pt took 1000mg  tylenol after her fall this am. Pt was hypertensive with EMS at 196/102. Pt recently had back surgery and c/o lower back pain.

## 2018-11-24 LAB — CBC
HCT: 43.2 % (ref 36.0–46.0)
Hemoglobin: 14.5 g/dL (ref 12.0–15.0)
MCH: 33.3 pg (ref 26.0–34.0)
MCHC: 33.6 g/dL (ref 30.0–36.0)
MCV: 99.3 fL (ref 80.0–100.0)
Platelets: 225 10*3/uL (ref 150–400)
RBC: 4.35 MIL/uL (ref 3.87–5.11)
RDW: 13.2 % (ref 11.5–15.5)
WBC: 9.4 10*3/uL (ref 4.0–10.5)
nRBC: 0 % (ref 0.0–0.2)

## 2018-11-24 LAB — BASIC METABOLIC PANEL
Anion gap: 9 (ref 5–15)
BUN: 9 mg/dL (ref 8–23)
CO2: 21 mmol/L — ABNORMAL LOW (ref 22–32)
Calcium: 9 mg/dL (ref 8.9–10.3)
Chloride: 109 mmol/L (ref 98–111)
Creatinine, Ser: 0.61 mg/dL (ref 0.44–1.00)
GFR calc Af Amer: >60 mL/min (ref >60)
GFR calc non Af Amer: >60 mL/min (ref >60)
Glucose, Bld: 114 mg/dL — ABNORMAL HIGH (ref 70–99)
Potassium: 3.1 mmol/L — ABNORMAL LOW (ref 3.5–5.1)
Sodium: 139 mmol/L (ref 135–145)

## 2018-11-24 MED ORDER — AMLODIPINE BESYLATE 5 MG PO TABS
5.0000 mg | ORAL_TABLET | Freq: Every day | ORAL | Status: DC
  Administered 2018-11-24 – 2018-11-26 (×3): 5 mg via ORAL
  Filled 2018-11-24 (×3): qty 1

## 2018-11-24 MED ORDER — HYDRALAZINE HCL 20 MG/ML IJ SOLN: 10.0000 mg | Freq: Four times a day (QID) | INTRAMUSCULAR | Status: DC | PRN

## 2018-11-24 NOTE — Consult Note (Signed)
ORTHOPAEDICS PROGRESS NOTE  PATIENT NAME: Emma Mejia DOB: 1946-07-08  MRN: [id number]  Subjective: The patient reports pain with any attempted movement such as sitting up. She has tolerated the lumbosacral orthosis reasonably well thus far.  Objective: Vital signs in last 24 hours: Temp:  [98 F (36.7 C)-99.4 F (37.4 C)] 98.7 F (37.1 C) (04/19 0728) Pulse Rate:  [84-107] 107 (04/19 0728) Resp:  [14-18] 18 (04/19 0542) BP: (149-183)/(72-113) 157/76 (04/19 0728) SpO2:  [89 %-96 %] 90 % (04/19 0728) Weight:  [54 kg] 54 kg (04/18 1112)  Intake/Output from previous day: No intake/output data recorded.  Recent Labs    11/23/18 1208 11/24/18 0528  WBC 11.4* 9.4  HGB 15.5* 14.5  HCT 46.7* 43.2  PLT 227 225  K 3.7 3.1*  CL 109 109  CO2 23 21*  BUN 9 9  CREATININE 0.58 0.61  GLUCOSE 109* 114*  CALCIUM 8.9 9.0    EXAM General: Well-developed well-nourished female seen in no apparent discomfort laying supine in the bed. Back:LSO in place. Tender to palpation along the lower lumbar region.  Difficult guarding with attempts at logrolled or sitting up. Neurologic: Awake, alert, and oriented.  Sensory and motor function are grossly intact.  Assessment: L3 vertebral compression fracture  Plan: I have ordered physical therapy to evaluate the patient for transfers and mobilization. I informed Dr. Rosita Kea of the patient's admission.  He will review the films.  James P. Angie Fava M.D.

## 2018-11-24 NOTE — Evaluation (Signed)
Physical Therapy Evaluation Patient Details Name: Emma Mejia MRN: [id number] DOB: 03-Apr-1946 Today's Date: 11/24/2018   History of Present Illness  Patient is a 73 year old female presented to hospital s/p mechanical fall on a wet surface. Patient found to have L3 vertebral compression fracture and is awaiting surgical consult. Patient has PMH: t10 kyphoplasty, HTN, h/o brain aneurysm, h/o brain surgery, COPD, (+) smoker  Clinical Impression  Patient in bed wearing TLSO and resting supine with 1L O2 via [place] upon PT arrival. Patient amenable to therapy. Patient's SpO2 resting supine was 94%, HR 91bpm. Patient denies pain but grimaces with movement and notes greater pain with RLE movement than LLE. Patient able to set up and initiate log roll for bed mobility with VCs and Mod Assistance but requires Max Assistance to complete supine<>sidelying. Patient unwilling to participate in sidelying<>sit with Max Assist 2/2 to pain. Patient able to transfer sidelying to supine with MinA. Patient willing to perform supine BLE but requires assistance to lift BLE against gravity indicating strength deficits. Patient will benefit from continued skilled therapeutic intervention to address deficits in strength, mobility, balance, and safety to improve overall function.   Based on patient's presentation and limited mobility during today's evaluation, SNF is recommended for discharge.   Patient left supine in TLSO with 1L O2 via [place] in bed with phone and call bell in reach; bed alarm set, all needs met.    Follow Up Recommendations SNF    Equipment Recommendations  Other (comment)(TBD at next venue of care)    Recommendations for Other Services       Precautions / Restrictions Precautions Precautions: Fall;Back Precaution Comments: TLSO brace at all times Required Braces or Orthoses: Other Brace Other Brace: TLSO brace at all times Restrictions Weight Bearing Restrictions: No      Mobility  Bed  Mobility Overal bed mobility: Needs Assistance Bed Mobility: Rolling Rolling: Max assist         General bed mobility comments: Patient able to set up and initiate task with VCs and Mod A, but unable to complete without Max Assist  Transfers Overall transfer level: Needs assistance               General transfer comment: Patient refused to transfer sidelying to sitting 2/2 to increase in pain.  Ambulation/Gait             General Gait Details: Unable to assess at this time due to limitations in mobility and pain.  Stairs            Wheelchair Mobility    Modified Rankin (Stroke Patients Only)       Balance Overall balance assessment: (Unable to assess at this time. Patient limited to log roll to sidelying with assistance. )                                           Pertinent Vitals/Pain Pain Assessment: No/denies pain    Home Living Family/patient expects to be discharged to:: Private residence Living Arrangements: Children Available Help at Discharge: Family Type of Home: House Home Access: Stairs to enter Entrance Stairs-Rails: Right;Left;Can reach both Technical brewer of Steps: 4-5 Home Layout: One level Home Equipment: Grab bars - toilet;Cane - quad      Prior Function Level of Independence: Needs assistance         Comments: Patient states that she has  been needing assistance for community activities and some ADLs but does not offer any further insights.     Hand Dominance        Extremity/Trunk Assessment   Upper Extremity Assessment Upper Extremity Assessment: Generalized weakness    Lower Extremity Assessment Lower Extremity Assessment: Generalized weakness       Communication   Communication: No difficulties  Cognition Arousal/Alertness: Awake/alert Behavior During Therapy: WFL for tasks assessed/performed Overall Cognitive Status: Within Functional Limits for tasks assessed                                         General Comments      Exercises Other Exercises Other Exercises: Supine exercises, BLE : ankle pumps x10, heel slides x5   Assessment/Plan    PT Assessment Patient needs continued PT services  PT Problem List Decreased strength;Decreased mobility;Decreased range of motion;Decreased activity tolerance;Decreased balance;Decreased knowledge of use of DME;Decreased knowledge of precautions;Decreased safety awareness       PT Treatment Interventions DME instruction;Functional mobility training;Balance training;Patient/family education;Gait training;Therapeutic activities;Neuromuscular re-education;Stair training;Therapeutic exercise    PT Goals (Current goals can be found in the Care Plan section)  Acute Rehab PT Goals Patient Stated Goal: go to my son's house PT Goal Formulation: With patient Time For Goal Achievement: 12/08/18 Potential to Achieve Goals: Fair    Frequency 7X/week   Barriers to discharge        Co-evaluation               AM-PAC PT "6 Clicks" Mobility  Outcome Measure Help needed turning from your back to your side while in a flat bed without using bedrails?: A Lot Help needed moving from lying on your back to sitting on the side of a flat bed without using bedrails?: Total Help needed moving to and from a bed to a chair (including a wheelchair)?: Total Help needed standing up from a chair using your arms (e.g., wheelchair or bedside chair)?: Total Help needed to walk in hospital room?: Total Help needed climbing 3-5 steps with a railing? : Total 6 Click Score: 7    End of Session Equipment Utilized During Treatment: Back brace Activity Tolerance: Patient limited by pain;Patient limited by fatigue Patient left: in bed;with bed alarm set;with call bell/phone within reach Nurse Communication: Mobility status PT Visit Diagnosis: Muscle weakness (generalized) (M62.81);Unsteadiness on feet (R26.81);Difficulty in  walking, not elsewhere classified (R26.2);History of falling (Z91.81);Pain    Time: 1137-1202 PT Time Calculation (min) (ACUTE ONLY): 25 min   Charges:   PT Evaluation $PT Eval Low Complexity: 1 Low PT Treatments $Therapeutic Activity: 8-22 mins      Myles Gip PT, DPT [phone]  11/24/2018, 12:43 PM

## 2018-11-24 NOTE — Progress Notes (Signed)
[employer] [facility] - [place] at [facility]   PATIENT NAME: Emma Mejia    MR#:  [id number]  DATE OF BIRTH:  [birth year]  SUBJECTIVE:   Patient here with mechanical fall and suffered L3 vertebral compression fracture. Continues to endorse back pain  REVIEW OF SYSTEMS:    Review of Systems  Constitutional: Negative for fever, chills weight loss HENT: Negative for ear pain, nosebleeds, congestion, facial swelling, rhinorrhea, neck pain, neck stiffness and ear discharge.   Respiratory: Negative for cough, shortness of breath, wheezing  Cardiovascular: Negative for chest pain, palpitations and leg swelling.  Gastrointestinal: Negative for heartburn, abdominal pain, vomiting, diarrhea or consitpation Genitourinary: Negative for dysuria, urgency, frequency, hematuria Musculoskeletal: ++back pain Neurological: Negative for dizziness, seizures, syncope, focal weakness,  numbness and headaches.  Hematological: Does not bruise/bleed easily.  Psychiatric/Behavioral: Negative for hallucinations, confusion, dysphoric mood    Tolerating Diet: yes      DRUG ALLERGIES:   Allergies  Allergen Reactions  . Ibuprofen   . Naproxen     VITALS:  Blood pressure (!) 157/76, pulse (!) 107, temperature 98.7 F (37.1 C), temperature source Oral, resp. rate 18, height 5\' 5"  (1.651 m), weight 54 kg, SpO2 90 %.  PHYSICAL EXAMINATION:  Constitutional: Appears well-developed and well-nourished. No distress. HENT: Normocephalic. Marland Kitchen Oropharynx is clear and moist.  Eyes: Conjunctivae and EOM are normal. PERRLA, no scleral icterus.  Neck: Normal ROM. Neck supple. No JVD. No tracheal deviation. CVS: RRR, S1/S2 +, no murmurs, no gallops, no carotid bruit.  Pulmonary: Effort and breath sounds normal, no stridor, rhonchi, wheezes, rales.  Abdominal: Soft. BS +,  no distension, tenderness, rebound or guarding.  Musculoskeletal:LSO in placed Neuro: Alert. CN 2-12 grossly intact. No focal  deficits. Skin: Skin is warm and dry. No rash noted. Psychiatric: Normal mood and affect.      LABORATORY PANEL:   CBC Recent Labs  Lab 11/24/18 0528  WBC 9.4  HGB 14.5  HCT 43.2  PLT 225   ------------------------------------------------------------------------------------------------------------------  Chemistries  Recent Labs  Lab 11/24/18 0528  NA 139  K 3.1*  CL 109  CO2 21*  GLUCOSE 114*  BUN 9  CREATININE 0.61  CALCIUM 9.0   ------------------------------------------------------------------------------------------------------------------  Cardiac Enzymes Recent Labs  Lab 11/23/18 1208  TROPONINI 0.04*   ------------------------------------------------------------------------------------------------------------------  RADIOLOGY:  Ct Lumbar Spine Wo Contrast  Result Date: 11/23/2018 CLINICAL DATA:  Back pain status post fall off the porch. Decreased sensation in the right leg EXAM: CT LUMBAR SPINE WITHOUT CONTRAST TECHNIQUE: Multidetector CT imaging of the lumbar spine was performed without intravenous contrast administration. Multiplanar CT image reconstructions were also generated. 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 Please refer to the enumerated sequence prior to any intervention. Alignment: Normal. Vertebrae: Chronic L4 vertebral body compression fracture with approximately 40% height loss. Chronic L5 vertebral body compression fracture with approximately 50% central height loss. Acute L3 vertebral body compression fracture with approximately 20% height loss and disruption of the posterior cortex. Remainder the vertebral body heights are maintained. Generalized osteopenia. No aggressive osseous lesion. Paraspinal and other soft tissues: No acute paraspinal abnormality. Abdominal aortic atherosclerosis. Disc levels: Disc spaces are maintained. T12-L1: No disc protrusion  or foraminal stenosis. Mild bilateral facet arthropathy. L1-L2: No disc protrusion. Mild bilateral facet arthropathy. No foraminal stenosis. L2-L3: Mild broad-based disc bulge. Mild bilateral facet arthropathy. No foraminal stenosis. L3-L4: Minimal broad-based disc bulge. Mild bilateral facet arthropathy. No foraminal stenosis. L4-L5:  Broad-based disc bulge. Moderate bilateral facet arthropathy. No foraminal stenosis. L5-S1: Broad-based disc bulge. Moderate bilateral facet arthropathy. Mild bilateral foraminal narrowing. IMPRESSION: 1. Acute mild burst compression fracture of the L3 vertebral body with approximately 20% height loss. No significant retropulsion of vertebral body. Electronically Signed   By: Elige KoHetal  Patel   On: 11/23/2018 12:02   Mr Lumbar Spine Wo Contrast  Result Date: 11/23/2018 CLINICAL DATA:  Status post fall, right leg decreased sensation EXAM: MRI LUMBAR SPINE WITHOUT CONTRAST TECHNIQUE: Multiplanar, multisequence MR imaging of the lumbar spine was performed. No intravenous contrast was administered. 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 Please refer to the enumerated sequence prior to any intervention. Alignment:  Physiologic. Vertebrae:  Chronic L4 and L5 vertebral body compression fractures. Acute L3 vertebral body compression fracture with marrow edema throughout the vertebral body and approximately 20% height loss. Fracture involves the posterior cortex of the L3 vertebral body with 3 mm of retropulsion focally. Epidural hematoma emanating from the posterior fracture cleft and extending cephalad with the overall epidural hematoma measuring 9 x 13 x 32 mm and resulting in moderate-severe spinal stenosis at L2-3. No discitis or osteomyelitis.  No aggressive osseous lesion. Conus medullaris and cauda equina: Conus extends to the L1 level. Conus and cauda equina appear normal. Paraspinal and other  soft tissues: Edema in the left psoas muscle with expansion likely reflecting muscle strain. Disc levels: Disc spaces: Degenerative disc disease with disc desiccation at L4-5 and L5-S1. T12-L1: Minimal broad-based disc bulge. No evidence of neural foraminal stenosis. No central canal stenosis. L1-L2: No significant disc bulge. No evidence of neural foraminal stenosis. No central canal stenosis. L2-L3: No significant disc bulge. Epidural hemorrhage with moderate-severe spinal stenosis. No evidence of neural foraminal stenosis. L3-L4: Mild broad-based disc bulge. Small epidural hemorrhage and mild spinal stenosis. No evidence of neural foraminal stenosis. L4-L5: Minimal broad-based disc bulge. Moderate left foraminal narrowing. No right foraminal narrowing. No central canal stenosis. L5-S1: Mild broad-based disc bulge. Mild bilateral facet arthropathy. Moderate left foraminal stenosis. No central canal stenosis. IMPRESSION: 1. Acute L3 vertebral body compression fracture with marrow edema throughout the vertebral body and approximately 20% height loss. Fracture involves the posterior cortex of the L3 vertebral body with 3 mm of retropulsion focally. Acute epidural hematoma emanating from the posterior fracture cleft and extending cephalad with the overall epidural hematoma measuring 9 x 13 x 32 mm and resulting in moderate-severe spinal stenosis at L2-3. Electronically Signed   By: Elige KoHetal  Patel   On: 11/23/2018 15:13   Dg Chest Portable 1 View  Result Date: 11/23/2018 CLINICAL DATA:  Pain after fall EXAM: PORTABLE CHEST 1 VIEW COMPARISON:  None. FINDINGS: Cardiomegaly. Previous vertebroplasty of a lower thoracic vertebral body. Mild atelectasis in the lateral left lung base. The hila and mediastinum are normal. No other acute abnormalities. IMPRESSION: No active disease. Electronically Signed   By: Gerome Samavid  Williams III M.D   On: 11/23/2018 12:23   Dg Hip Unilat W Or Wo Pelvis 2-3 Views Right  Result Date:  11/23/2018 CLINICAL DATA:  Pain after fall. EXAM: DG HIP (WITH OR WITHOUT PELVIS) 2-3V RIGHT COMPARISON:  None. FINDINGS: There is no evidence of hip fracture or dislocation. There is no evidence of arthropathy or other focal bone abnormality. IMPRESSION: Negative. Electronically Signed   By: Gerome Samavid  Williams III M.D   On: 11/23/2018 12:23     ASSESSMENT AND PLAN:   73 year old female  with a history of essential hypertension, brain aneurysm and previous kyphoplasty for vertebral fracture who presented after mechanical fall and found to have acute L3 compression fracture.  1.  L3 compression fracture resulting in severe back pain: Patient evaluated by orthopedic surgery.  Dr. Rosita Kea to review films for possible kyphoplasty.  Continue pain control and physical therapy consultation.  Continue LSO brace as per recommendations by orthopedics. ISS ordered to prevent PNA.   2.  Accelerated hypertension in the setting of pain: Continue lisinopril and metoprolol.  Add low-dose Norvasc.  Continue pain control  3.  Hypothyroidism: Continue Synthroid  4.  Hypokalemia: Replete and recheck in a.m. 5.Tobacco dependence: Patient is encouraged to quit smoking and willing to attempt to quit was assessed. Patient highly motivated.Counseling was provided for 4 minutes. She will continue nicotine patch.     Management plans discussed with the patient and she is in agreement.  CODE STATUS: full  TOTAL TIME TAKING CARE OF THIS PATIENT: 30 minutes.     POSSIBLE D/C 1-2 days, DEPENDING ON CLINICAL CONDITION.   Adrian Saran M.D on 11/24/2018 at 9:57 AM  Between 7am to 6pm - Pager - [phone] After 6pm go to [email or web address] - password [email or web address]  [facility] [place]  Office  [phone]  CC: Primary care physician; Bertram Savin, MD  Note: This dictation was prepared with Dragon dictation along with smaller phrase technology. Any transcriptional errors that result from this process are  unintentional.

## 2018-11-25 ENCOUNTER — Inpatient Hospital Stay: Payer: Medicare Other | Admitting: Anesthesiology

## 2018-11-25 ENCOUNTER — Encounter
Admission: EM | Disposition: A | Discharge status: Skilled Nursing Facility | Payer: Self-pay | Source: Home / Self Care | Attending: Internal Medicine

## 2018-11-25 ENCOUNTER — Inpatient Hospital Stay: Payer: Medicare Other

## 2018-11-25 HISTORY — PX: KYPHOPLASTY: SHX5884

## 2018-11-25 LAB — BASIC METABOLIC PANEL
Anion gap: 10 (ref 5–15)
BUN: 16 mg/dL (ref 8–23)
CO2: 21 mmol/L — ABNORMAL LOW (ref 22–32)
Calcium: 8.7 mg/dL — ABNORMAL LOW (ref 8.9–10.3)
Chloride: 109 mmol/L (ref 98–111)
Creatinine, Ser: 0.57 mg/dL (ref 0.44–1.00)
GFR calc Af Amer: >60 mL/min (ref >60)
GFR calc non Af Amer: >60 mL/min (ref >60)
Glucose, Bld: 104 mg/dL — ABNORMAL HIGH (ref 70–99)
Potassium: 3.3 mmol/L — ABNORMAL LOW (ref 3.5–5.1)
Sodium: 140 mmol/L (ref 135–145)

## 2018-11-25 SURGERY — KYPHOPLASTY
Anesthesia: General

## 2018-11-25 MED ORDER — CEFAZOLIN SODIUM-DEXTROSE 1-4 GM/50ML-% IV SOLN
1.0000 g | Freq: Once | INTRAVENOUS | Status: AC
  Administered 2018-11-25: 12:00:00 1 g via INTRAVENOUS
  Filled 2018-11-25: qty 50

## 2018-11-25 MED ORDER — FENTANYL CITRATE (PF) 100 MCG/2ML IJ SOLN: 25.0000 ug | INTRAMUSCULAR | Status: DC | PRN

## 2018-11-25 MED ORDER — LIDOCAINE HCL 1 % IJ SOLN
INTRAMUSCULAR | Status: DC | PRN
  Administered 2018-11-25 (×2): 10 mL

## 2018-11-25 MED ORDER — METOCLOPRAMIDE HCL 5 MG/ML IJ SOLN: 5.0000 mg | Freq: Three times a day (TID) | INTRAMUSCULAR | Status: DC | PRN

## 2018-11-25 MED ORDER — LIDOCAINE HCL (PF) 1 % IJ SOLN
INTRAMUSCULAR | Status: AC
  Filled 2018-11-25: qty 60

## 2018-11-25 MED ORDER — ONDANSETRON HCL 4 MG/2ML IJ SOLN: 4.0000 mg | Freq: Once | INTRAMUSCULAR | Status: DC | PRN

## 2018-11-25 MED ORDER — CEFAZOLIN (ANCEF) 1 G IV SOLR: 1.0000 g | INTRAVENOUS | Status: DC

## 2018-11-25 MED ORDER — DOCUSATE SODIUM 100 MG PO CAPS
100.0000 mg | ORAL_CAPSULE | Freq: Two times a day (BID) | ORAL | Status: DC
  Administered 2018-11-25 – 2018-11-26 (×2): 100 mg via ORAL
  Filled 2018-11-25 (×2): qty 1

## 2018-11-25 MED ORDER — PROPOFOL 500 MG/50ML IV EMUL
INTRAVENOUS | Status: AC
  Filled 2018-11-25: qty 50

## 2018-11-25 MED ORDER — FENTANYL CITRATE (PF) 100 MCG/2ML IJ SOLN
INTRAMUSCULAR | Status: AC
  Filled 2018-11-25: qty 2

## 2018-11-25 MED ORDER — PHENYLEPHRINE HCL (PRESSORS) 10 MG/ML IV SOLN
INTRAVENOUS | Status: DC | PRN
  Administered 2018-11-25: 50 ug via INTRAVENOUS

## 2018-11-25 MED ORDER — LATANOPROST 0.005 % OP SOLN: 1.0000 [drp] | Freq: Every evening | OPHTHALMIC | Status: DC

## 2018-11-25 MED ORDER — METOCLOPRAMIDE HCL 5 MG PO TABS: 5.0000 mg | ORAL_TABLET | Freq: Three times a day (TID) | ORAL | Status: DC | PRN

## 2018-11-25 MED ORDER — LIDOCAINE HCL (CARDIAC) PF 100 MG/5ML IV SOSY
PREFILLED_SYRINGE | INTRAVENOUS | Status: DC | PRN
  Administered 2018-11-25: 60 mg via INTRAVENOUS

## 2018-11-25 MED ORDER — BUPIVACAINE-EPINEPHRINE (PF) 0.5% -1:200000 IJ SOLN
INTRAMUSCULAR | Status: AC
  Filled 2018-11-25: qty 30

## 2018-11-25 MED ORDER — PROPOFOL 500 MG/50ML IV EMUL
INTRAVENOUS | Status: DC | PRN
  Administered 2018-11-25: 50 ug/kg/min via INTRAVENOUS

## 2018-11-25 MED ORDER — LACTATED RINGERS IV SOLN
INTRAVENOUS | Status: DC
  Administered 2018-11-25: 12:00:00 via INTRAVENOUS

## 2018-11-25 MED ORDER — PROPOFOL 10 MG/ML IV BOLUS
INTRAVENOUS | Status: DC | PRN
  Administered 2018-11-25: 20 mg via INTRAVENOUS

## 2018-11-25 MED ORDER — BUPIVACAINE-EPINEPHRINE (PF) 0.5% -1:200000 IJ SOLN
INTRAMUSCULAR | Status: DC | PRN
  Administered 2018-11-25: 10 mL via PERINEURAL

## 2018-11-25 MED ORDER — LIDOCAINE HCL (PF) 2 % IJ SOLN
INTRAMUSCULAR | Status: AC
  Filled 2018-11-25: qty 10

## 2018-11-25 MED ORDER — POTASSIUM CHLORIDE CRYS ER 20 MEQ PO TBCR
40.0000 meq | EXTENDED_RELEASE_TABLET | Freq: Once | ORAL | Status: AC
  Administered 2018-11-25: 40 meq via ORAL
  Filled 2018-11-25: qty 2

## 2018-11-25 MED ORDER — SODIUM CHLORIDE 0.9 % IV SOLN
INTRAVENOUS | Status: DC | PRN
  Administered 2018-11-25: 12:00:00 via INTRAVENOUS

## 2018-11-25 MED ORDER — FENTANYL CITRATE (PF) 100 MCG/2ML IJ SOLN
INTRAMUSCULAR | Status: DC | PRN
  Administered 2018-11-25: 50 ug via INTRAVENOUS

## 2018-11-25 MED ORDER — CEFAZOLIN SODIUM-DEXTROSE 1-4 GM/50ML-% IV SOLN
INTRAVENOUS | Status: AC
  Filled 2018-11-25: qty 50

## 2018-11-25 SURGICAL SUPPLY — 18 items
CEMENT KYPHON CX01A KIT/MIXER (Cement) ×2 IMPLANT
COVER WAND RF STERILE (DRAPES) ×2 IMPLANT
DERMABOND ADVANCED (GAUZE/BANDAGES/DRESSINGS) ×1
DERMABOND ADVANCED .7 DNX12 (GAUZE/BANDAGES/DRESSINGS) ×1 IMPLANT
DEVICE BIOPSY BONE KYPHX (INSTRUMENTS) ×2 IMPLANT
DRAPE C-ARM XRAY 36X54 (DRAPES) ×2 IMPLANT
DURAPREP 26ML APPLICATOR (WOUND CARE) ×2 IMPLANT
GLOVE SURG SYN 9.0  PF PI (GLOVE) ×1
GLOVE SURG SYN 9.0 PF PI (GLOVE) ×1 IMPLANT
GOWN SRG 2XL LVL 4 RGLN SLV (GOWNS) ×1 IMPLANT
GOWN STRL NON-REIN 2XL LVL4 (GOWNS) ×1
GOWN STRL REUS W/ TWL LRG LVL3 (GOWN DISPOSABLE) ×1 IMPLANT
GOWN STRL REUS W/TWL LRG LVL3 (GOWN DISPOSABLE) ×1
PACK KYPHOPLASTY (MISCELLANEOUS) ×2 IMPLANT
RENTAL RFA GENERATOR (MISCELLANEOUS) IMPLANT
STRAP SAFETY 5IN WIDE (MISCELLANEOUS) IMPLANT
TRAY KYPHOPAK 15/3 EXPRESS 1ST (MISCELLANEOUS) IMPLANT
TRAY KYPHOPAK 20/3 EXPRESS 1ST (MISCELLANEOUS) ×2 IMPLANT

## 2018-11-25 NOTE — Anesthesia Post-op Follow-up Note (Signed)
Anesthesia QCDR form completed.        

## 2018-11-25 NOTE — Progress Notes (Signed)
[employer] [facility] - [place] at [facility]   PATIENT NAME: Emma Mejia    MR#:  [id number]  DATE OF BIRTH:  [birth year]  SUBJECTIVE:   Patient here with mechanical fall and suffered L3 vertebral compression fracture. Endorsing 9 out of 10 back pain today.  Pain gets worse with minimal movement scheduled for kyphoplasty at 11 AM  REVIEW OF SYSTEMS:    Review of Systems  Constitutional: Negative for fever, chills weight loss HENT: Negative for ear pain, nosebleeds, congestion, facial swelling, rhinorrhea, neck pain, neck stiffness and ear discharge.   Respiratory: Negative for cough, shortness of breath, wheezing  Cardiovascular: Negative for chest pain, palpitations and leg swelling.  Gastrointestinal: Negative for heartburn, abdominal pain, vomiting, diarrhea or consitpation Genitourinary: Negative for dysuria, urgency, frequency, hematuria Musculoskeletal: ++back pain Neurological: Negative for dizziness, seizures, syncope, focal weakness,  numbness and headaches.  Hematological: Does not bruise/bleed easily.  Psychiatric/Behavioral: Negative for hallucinations, confusion, dysphoric mood    Tolerating Diet: yes      DRUG ALLERGIES:   Allergies  Allergen Reactions  . Ibuprofen   . Naproxen     VITALS:  Blood pressure 132/77, pulse (!) 102, temperature 99.3 F (37.4 C), temperature source Temporal, resp. rate 20, height 5\' 5"  (1.651 m), weight 54 kg, SpO2 97 %.  PHYSICAL EXAMINATION:  Constitutional: Appears well-developed and well-nourished. No distress. HENT: Normocephalic. Marland Kitchen Oropharynx is clear and moist.  Eyes: Conjunctivae and EOM are normal. PERRLA, no scleral icterus.  Neck: Normal ROM. Neck supple. No JVD. No tracheal deviation. CVS: RRR, S1/S2 +, no murmurs, no gallops, no carotid bruit.  Pulmonary: Effort and breath sounds normal, no stridor, rhonchi, wheezes, rales.  Abdominal: Soft. BS +,  no distension, tenderness, rebound or guarding.   Musculoskeletal:LSO in placed Neuro: Alert. CN 2-12 grossly intact. No focal deficits. Skin: Skin is warm and dry. No rash noted. Psychiatric: Normal mood and affect.      LABORATORY PANEL:   CBC Recent Labs  Lab 11/24/18 0528  WBC 9.4  HGB 14.5  HCT 43.2  PLT 225   ------------------------------------------------------------------------------------------------------------------  Chemistries  Recent Labs  Lab 11/25/18 0505  NA 140  K 3.3*  CL 109  CO2 21*  GLUCOSE 104*  BUN 16  CREATININE 0.57  CALCIUM 8.7*   ------------------------------------------------------------------------------------------------------------------  Cardiac Enzymes Recent Labs  Lab 11/23/18 1208  TROPONINI 0.04*   ------------------------------------------------------------------------------------------------------------------  RADIOLOGY:  Mr Lumbar Spine Wo Contrast  Result Date: 11/23/2018 CLINICAL DATA:  Status post fall, right leg decreased sensation EXAM: MRI LUMBAR SPINE WITHOUT CONTRAST TECHNIQUE: Multiplanar, multisequence MR imaging of the lumbar spine was performed. No intravenous contrast was administered. 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 Please refer to the enumerated sequence prior to any intervention. Alignment:  Physiologic. Vertebrae:  Chronic L4 and L5 vertebral body compression fractures. Acute L3 vertebral body compression fracture with marrow edema throughout the vertebral body and approximately 20% height loss. Fracture involves the posterior cortex of the L3 vertebral body with 3 mm of retropulsion focally. Epidural hematoma emanating from the posterior fracture cleft and extending cephalad with the overall epidural hematoma measuring 9 x 13 x 32 mm and resulting in moderate-severe spinal stenosis at L2-3. No discitis or osteomyelitis.  No aggressive osseous lesion. Conus  medullaris and cauda equina: Conus extends to the L1 level. Conus and cauda equina appear normal. Paraspinal and other soft tissues: Edema in the left psoas muscle with expansion likely  reflecting muscle strain. Disc levels: Disc spaces: Degenerative disc disease with disc desiccation at L4-5 and L5-S1. T12-L1: Minimal broad-based disc bulge. No evidence of neural foraminal stenosis. No central canal stenosis. L1-L2: No significant disc bulge. No evidence of neural foraminal stenosis. No central canal stenosis. L2-L3: No significant disc bulge. Epidural hemorrhage with moderate-severe spinal stenosis. No evidence of neural foraminal stenosis. L3-L4: Mild broad-based disc bulge. Small epidural hemorrhage and mild spinal stenosis. No evidence of neural foraminal stenosis. L4-L5: Minimal broad-based disc bulge. Moderate left foraminal narrowing. No right foraminal narrowing. No central canal stenosis. L5-S1: Mild broad-based disc bulge. Mild bilateral facet arthropathy. Moderate left foraminal stenosis. No central canal stenosis. IMPRESSION: 1. Acute L3 vertebral body compression fracture with marrow edema throughout the vertebral body and approximately 20% height loss. Fracture involves the posterior cortex of the L3 vertebral body with 3 mm of retropulsion focally. Acute epidural hematoma emanating from the posterior fracture cleft and extending cephalad with the overall epidural hematoma measuring 9 x 13 x 32 mm and resulting in moderate-severe spinal stenosis at L2-3. Electronically Signed   By: Elige KoHetal  Patel   On: 11/23/2018 15:13     ASSESSMENT AND PLAN:   73 year old female with a history of essential hypertension, brain aneurysm and previous kyphoplasty for vertebral fracture who presented after mechanical fall and found to have acute L3 compression fracture.  1.  L3 compression fracture resulting in severe back pain: Patient evaluated by orthopedic surgery.  Dr. Rosita KeaMenz scheduled kyphoplasty at 11 AM  today  continue pain control and physical therapy consultation.  Continue LSO brace as per recommendations by orthopedics. ISS ordered to prevent PNA.   2.  Accelerated hypertension in the setting of pain: Continue lisinopril and metoprolol.  Add low-dose Norvasc.  Continue pain control  3.  Hypothyroidism: Continue Synthroid  4.  Hypokalemia: Replete and recheck is 3.7  5.Tobacco dependence: Patient is encouraged to quit smoking and willing to attempt to quit was assessed. Patient highly motivated.Counseling was provided She will continue nicotine patch.  6.  Generalized weakness physical therapy recommending skilled nursing facility follow-up with social worker   Management plans discussed with the patient and she is in agreement.  CODE STATUS: full  TOTAL TIME TAKING CARE OF THIS PATIENT: 32 minutes.     POSSIBLE D/C 1-2 days, DEPENDING ON CLINICAL CONDITION.   Ramonita LabAruna Terriyah Westra M.D on 11/25/2018 at 12:04 PM  Between 7am to 6pm - Pager - [phone]  After 6pm go to [email or web address] - password [email or web address] [email or web address]  [facility] [place] [facility]  Office  [phone]  CC: Primary care physician; Bertram SavinFeltner, Cynthia K, MD  Note: This dictation was prepared with Dragon dictation along with smaller phrase technology. Any transcriptional errors that result from this process are unintentional.

## 2018-11-25 NOTE — Progress Notes (Signed)
PT Cancellation Note  Patient Details Name: Cecylia Panlilio MRN: [id number] DOB: 26-May-1946   Cancelled Treatment:    Reason Eval/Treat Not Completed: Other (comment)(PT confirmed with RN that patient is scheduled for kyphoplasty this AM. PT will hold until patient is medically appropriate and will need continuation orders for PT s/p surgery,)   Olga Coaster PT, DPT 10:49 AM,11/25/18 [phone]

## 2018-11-25 NOTE — NC FL2 (Signed)
[place] MEDICAID FL2 LEVEL OF CARE SCREENING TOOL     IDENTIFICATION  Patient Name: Emma Mejia Birthdate: [birth year] Sex: female Admission Date (Current Location): 11/23/2018  [place] and [place] Number:  Chiropodist and Address:  [facility], [address] [place], [place] [address]      Provider Number: [id number]  Attending Physician Name and Address:  Ramonita Lab, MD  Relative Name and Phone Number:  Nyrobi Harnois SOn [phone]    Current Level of Care: Hospital Recommended Level of Care: Skilled Nursing Facility Prior Approval Number:    Date Approved/Denied: 02/05/18 PASRR Number: [id number]  Discharge Plan: SNF    Current Diagnoses: Patient Active Problem List   Diagnosis Date Noted  . Vertebral compression fracture (HCC) 11/23/2018  . Compression fracture of body of thoracic vertebra (HCC) 07/07/2018    Orientation RESPIRATION BLADDER Height & Weight     Self, Time, Place  Normal Continent Weight: 54 kg Height:  5\' 5"  (165.1 cm)  BEHAVIORAL SYMPTOMS/MOOD NEUROLOGICAL BOWEL NUTRITION STATUS      Continent Diet(regular)  AMBULATORY STATUS COMMUNICATION OF NEEDS Skin   Extensive Assist Verbally Normal                       Personal Care Assistance Level of Assistance  Dressing, Bathing Bathing Assistance: Maximum assistance   Dressing Assistance: Maximum assistance     Functional Limitations Info  Sight, Hearing, Speech Sight Info: Adequate Hearing Info: Adequate Speech Info: Adequate    SPECIAL CARE FACTORS FREQUENCY  PT (By licensed PT)     PT Frequency: 5 times a week              Contractures Contractures Info: Not present    Additional Factors Info  Code Status Code Status Info: full             Current Medications (11/25/2018):  This is the current hospital active medication list Current Facility-Administered Medications  Medication Dose Route Frequency Provider  Last Rate Last Dose  . 0.9 %  sodium chloride infusion  250 mL Intravenous PRN Pyreddy, Vivien Rota, MD      . acetaminophen (TYLENOL) tablet 650 mg  650 mg Oral Q6H PRN Ihor Austin, MD   650 mg at 11/24/18 2217   Or  . acetaminophen (TYLENOL) suppository 650 mg  650 mg Rectal Q6H PRN Pyreddy, Vivien Rota, MD      . amLODipine (NORVASC) tablet 5 mg  5 mg Oral Daily Adrian Saran, MD   5 mg at 11/24/18 0916  . ceFAZolin (ANCEF) IVPB 1 g/50 mL premix  1 g Intravenous Once Kennedy Bucker, MD      . cholecalciferol (VITAMIN D) tablet 1,000 Units  1,000 Units Oral Daily Ihor Austin, MD   1,000 Units at 11/25/18 0829  . fluticasone (FLONASE) 50 MCG/ACT nasal spray 2 spray  2 spray Each Nare Daily Pyreddy, Pavan, MD   2 spray at 11/25/18 0827  . hydrALAZINE (APRESOLINE) injection 10 mg  10 mg Intravenous Q6H PRN Adrian Saran, MD      . HYDROcodone-acetaminophen (NORCO/VICODIN) 5-325 MG per tablet 1-2 tablet  1-2 tablet Oral Q4H PRN Ihor Austin, MD   1 tablet at 11/25/18 0846  . levothyroxine (SYNTHROID) tablet 75 mcg  75 mcg Oral Q0600 Ihor Austin, MD   75 mcg at 11/25/18 0550  . lisinopril (ZESTRIL) tablet 40 mg  40 mg Oral Daily Pyreddy, Vivien Rota, MD   40 mg at 11/24/18 0916  .  methocarbamol (ROBAXIN) tablet 500 mg  500 mg Oral Q8H PRN Pyreddy, Vivien RotaPavan, MD      . metoprolol succinate (TOPROL-XL) 24 hr tablet 100 mg  100 mg Oral Daily Pyreddy, Vivien RotaPavan, MD   100 mg at 11/24/18 0916  . morphine 2 MG/ML injection 2 mg  2 mg Intravenous Q4H PRN Ihor AustinPyreddy, Pavan, MD   2 mg at 11/25/18 0957  . nicotine (NICODERM CQ - dosed in mg/24 hours) patch 21 mg  21 mg Transdermal Daily Pyreddy, Pavan, MD      . ondansetron (ZOFRAN) tablet 4 mg  4 mg Oral Q6H PRN Pyreddy, Vivien RotaPavan, MD       Or  . ondansetron (ZOFRAN) injection 4 mg  4 mg Intravenous Q6H PRN Pyreddy, Pavan, MD      . pantoprazole (PROTONIX) EC tablet 20 mg  20 mg Oral Daily Pyreddy, Pavan, MD   20 mg at 11/25/18 0830  . senna-docusate (Senokot-S) tablet 1 tablet  1  tablet Oral QHS PRN Pyreddy, Vivien RotaPavan, MD      . sodium chloride flush (NS) 0.9 % injection 3 mL  3 mL Intravenous Q12H Pyreddy, Vivien RotaPavan, MD   3 mL at 11/25/18 0831  . sodium chloride flush (NS) 0.9 % injection 3 mL  3 mL Intravenous PRN Ihor AustinPyreddy, Pavan, MD         Discharge Medications: Please see discharge summary for a list of discharge medications.  Relevant Imaging Results:  Relevant Lab Results:   Additional Information [id number]  Barrie Dunkereliliah J Avon Mergenthaler, RN

## 2018-11-25 NOTE — Anesthesia Postprocedure Evaluation (Signed)
Anesthesia Post Note  Patient: Information systems manager  Procedure(s) Performed: KYPHOPLASTY L3 (N/A )  Patient location during evaluation: PACU Anesthesia Type: General Level of consciousness: awake and alert Pain management: pain level controlled Vital Signs Assessment: post-procedure vital signs reviewed and stable Respiratory status: spontaneous breathing, nonlabored ventilation and respiratory function stable Cardiovascular status: blood pressure returned to baseline and stable Postop Assessment: no signs of nausea or vomiting Anesthetic complications: no     Last Vitals:  Vitals:   11/25/18 1342 11/25/18 1406  BP: (!) 147/77 117/63  Pulse: 99 (!) 101  Resp: 16 15  Temp: 36.7 C (!) 36.3 C  SpO2: 96% 95%    Last Pain:  Vitals:   11/25/18 1406  TempSrc: Oral  PainSc:                  Kinsey Cowsert

## 2018-11-25 NOTE — Anesthesia Preprocedure Evaluation (Addendum)
Anesthesia Evaluation  Patient identified by MRN, date of birth, ID band Patient awake and Patient confused    Reviewed: Allergy & Precautions, NPO status , Patient's Chart, lab work & pertinent test results  History of Anesthesia Complications Negative for: history of anesthetic complications  Airway Mallampati: II  TM Distance: >3 FB Neck ROM: Full    Dental  (+) Edentulous Upper, Edentulous Lower   Pulmonary neg sleep apnea, neg COPD, Current Smoker,    breath sounds clear to auscultation- rhonchi (-) wheezing      Cardiovascular hypertension, Pt. on medications (-) CAD, (-) Past MI, (-) Cardiac Stents and (-) CABG  Rhythm:Regular Rate:Normal - Systolic murmurs and - Diastolic murmurs    Neuro/Psych negative neurological ROS  negative psych ROS   GI/Hepatic negative GI ROS, Neg liver ROS,   Endo/Other  negative endocrine ROSneg diabetes  Renal/GU negative Renal ROS     Musculoskeletal negative musculoskeletal ROS (+)   Abdominal (+) - obese,   Peds  Hematology negative hematology ROS (+)   Anesthesia Other Findings Past Medical History: No date: Brain aneurysm No date: Hypertension No date: Thyroid disease   Reproductive/Obstetrics                             Anesthesia Physical  Anesthesia Plan  ASA: II  Anesthesia Plan: General   Post-op Pain Management:    Induction: Intravenous  PONV Risk Score and Plan: 1 and Propofol infusion  Airway Management Planned: Natural Airway  Additional Equipment:   Intra-op Plan:   Post-operative Plan:   Informed Consent: I have reviewed the patients History and Physical, chart, labs and discussed the procedure including the risks, benefits and alternatives for the proposed anesthesia with the patient or authorized representative who has indicated his/her understanding and acceptance.     Dental advisory given and Consent reviewed  with POA (phone consent from pt's daughter Lynnell Chakrabarti)  Plan Discussed with: CRNA and Anesthesiologist  Anesthesia Plan Comments:         Anesthesia Quick Evaluation

## 2018-11-25 NOTE — Op Note (Signed)
Date 11/25/2018  Time  12:48 pm   PATIENT:  Emma Mejia   PRE-OPERATIVE DIAGNOSIS:  closed wedge compression fracture of L3   POST-OPERATIVE DIAGNOSIS:  closed wedge compression fracture of L3   PROCEDURE:  Procedure(s): KYPHOPLASTY L3  SURGEON: Laurene Footman, MD   ASSISTANTS: None   ANESTHESIA:   local and MAC   EBL:  No intake/output data recorded.   BLOOD ADMINISTERED:none   DRAINS: none    LOCAL MEDICATIONS USED:  MARCAINE    and XYLOCAINE    SPECIMEN:   None   DISPOSITION OF SPECIMEN:  Not applicable   COUNTS:  YES   TOURNIQUET:  * No tourniquets in log *   IMPLANTS: Bone cement   DICTATION: .Dragon Dictation  patient was brought to the operating room and after adequate anesthesia was obtained the patient was placed prone.  C arm was brought in in good visualization of the affected level obtained on both AP and lateral projections.  After patient identification and timeout procedures were completed, local anesthetic was infiltrated with 10 cc 1% Xylocaine infiltrated subcutaneously.  This is done the area on the right side of the planned approach.  The back was then prepped and draped in the usual sterile manner and repeat timeout procedure carried out.  A spinal needle was brought down to the pedicle on the right side of  L3 and a 50-50 mix of 1% Xylocaine half percent Sensorcaine with epinephrine total of 20 cc injected.  After allowing this to set a small incision was made and the trocar was advanced into the vertebral body in an extrapedicular fashion.  Biopsy was not obtained Drilling was carried out balloon inserted with inflation to  for cc.  When the cement was appropriate consistency 6 cc were injected into the vertebral body without extravasation, good fill superior to inferior endplates and from right to left sides along the inferior endplate.  After the cement had set the trochar was removed and permanent C-arm views obtained.  The wound was closed with  Dermabond followed by Band-Aid   PLAN OF CARE: Continue as inpatient   PATIENT DISPOSITION:  PACU - hemodynamically stable.

## 2018-11-25 NOTE — Consult Note (Signed)
Neurosurgery-New Consultation Evaluation 11/25/2018 Emma Mejia [id number]  Identifying Statement: Emma Mejia is a 73 y.o. female from [facility] [place] [address] with back pain  Physician Requesting Consultation: Ihor Austin, MD  History of Present Illness: Emma Mejia was admitted over the weekend after sustaining a mechanical fall and onset of severe back pain.  She does feel that the back pain was worse when she was standing.  She states that at home she is ambulating prior to this.  She is in a back brace currently and she does feel that with this on and laying supine, the pain is minimal.  She does not endorse any pain radiating down her legs.  She denies any new numbness.  In an MRI which did reveal an acute L3 vertebral compression fracture.  The ED did consult with outside neurosurgeon who reviewed the film and recommend conservative management.  She is scheduled for a kyphoplasty today.   Past Medical History:  Past Medical History:  Diagnosis Date  . Brain aneurysm   . Hypertension   . Thyroid disease     Social History: Social History   Socioeconomic History  . Marital status: Divorced    Spouse name: Not on file  . Number of children: Not on file  . Years of education: Not on file  . Highest education level: Not on file  Occupational History  . Not on file  Social Needs  . Financial resource strain: Not on file  . Food insecurity:    Worry: Not on file    Inability: Not on file  . Transportation needs:    Medical: Not on file    Non-medical: Not on file  Tobacco Use  . Smoking status: Current Every Day Smoker  . Smokeless tobacco: Never Used  Substance and Sexual Activity  . Alcohol use: Not Currently  . Drug use: Not Currently  . Sexual activity: Not on file  Lifestyle  . Physical activity:    Days per week: Not on file    Minutes per session: Not on file  . Stress: Not on file  Relationships  . Social connections:    Talks on phone: Not on  file    Gets together: Not on file    Attends religious service: Not on file    Active member of club or organization: Not on file    Attends meetings of clubs or organizations: Not on file    Relationship status: Not on file  . Intimate partner violence:    Fear of current or ex partner: Not on file    Emotionally abused: Not on file    Physically abused: Not on file    Forced sexual activity: Not on file  Other Topics Concern  . Not on file  Social History Narrative  . Not on file    Family History: History reviewed. No pertinent family history.  Review of Systems:  Review of Systems - General ROS: Negative Psychological ROS: Negative Ophthalmic ROS: Negative ENT ROS: Negative Hematological and Lymphatic ROS: Negative  Endocrine ROS: Negative Respiratory ROS: Negative Cardiovascular ROS: Negative Gastrointestinal ROS: Negative Genito-Urinary ROS: Negative Musculoskeletal ROS: Positive for back pain Neurological ROS: Negative for leg pain, numbness Dermatological ROS: Negative  Physical Exam: BP 132/77   Pulse (!) 102   Temp 99.3 F (37.4 C) (Temporal)   Resp 20   Ht 5\' 5"  (1.651 m)   Wt 54 kg   SpO2 97%   BMI 19.81 kg/m  Body mass index is 19.81  kg/m. Body surface area is 1.57 meters squared. General appearance: Alert, cooperative, in no acute distress Head: Normocephalic, atraumatic Eyes: Normal, EOM intact Oropharynx: Moist without lesions Back: Currently in rigid LSO. Ext: No edema in LE bilaterally, warm extremities  Neurologic exam:  Mental status: alertness: alert, some slow responses but able to answer simple questions and follow commands Speech: fluent and clear Motor:strength symmetric 5/5 lateral hip flexion, knee extension, dorsiflexion, plantarflexion Sensory: intact to light touch in bilateral lower extremities Gait: Not tested given injury  Laboratory: Results for orders placed or performed during the hospital encounter of 11/23/18   Basic metabolic panel  Result Value Ref Range   Sodium 140 135 - 145 mmol/L   Potassium 3.7 3.5 - 5.1 mmol/L   Chloride 109 98 - 111 mmol/L   CO2 23 22 - 32 mmol/L   Glucose, Bld 109 (H) 70 - 99 mg/dL   BUN 9 8 - 23 mg/dL   Creatinine, Ser 1.610.58 0.44 - 1.00 mg/dL   Calcium 8.9 8.9 - 09.610.3 mg/dL   GFR calc non Af Amer >60 >60 mL/min   GFR calc Af Amer >60 >60 mL/min   Anion gap 8 5 - 15  CBC with Differential  Result Value Ref Range   WBC 11.4 (H) 4.0 - 10.5 K/uL   RBC 4.68 3.87 - 5.11 MIL/uL   Hemoglobin 15.5 (H) 12.0 - 15.0 g/dL   HCT 04.546.7 (H) 40.936.0 - 81.146.0 %   MCV 99.8 80.0 - 100.0 fL   MCH 33.1 26.0 - 34.0 pg   MCHC 33.2 30.0 - 36.0 g/dL   RDW 91.413.4 78.211.5 - 95.615.5 %   Platelets 227 150 - 400 K/uL   nRBC 0.0 0.0 - 0.2 %   Neutrophils Relative % 85 %   Neutro Abs 9.5 (H) 1.7 - 7.7 K/uL   Lymphocytes Relative 11 %   Lymphs Abs 1.2 0.7 - 4.0 K/uL   Monocytes Relative 4 %   Monocytes Absolute 0.5 0.1 - 1.0 K/uL   Eosinophils Relative 0 %   Eosinophils Absolute 0.0 0.0 - 0.5 K/uL   Basophils Relative 0 %   Basophils Absolute 0.1 0.0 - 0.1 K/uL   Immature Granulocytes 0 %   Abs Immature Granulocytes 0.05 0.00 - 0.07 K/uL  Troponin I - Once  Result Value Ref Range   Troponin I 0.04 (HH) <0.03 ng/mL  Basic metabolic panel  Result Value Ref Range   Sodium 139 135 - 145 mmol/L   Potassium 3.1 (L) 3.5 - 5.1 mmol/L   Chloride 109 98 - 111 mmol/L   CO2 21 (L) 22 - 32 mmol/L   Glucose, Bld 114 (H) 70 - 99 mg/dL   BUN 9 8 - 23 mg/dL   Creatinine, Ser 2.130.61 0.44 - 1.00 mg/dL   Calcium 9.0 8.9 - 08.610.3 mg/dL   GFR calc non Af Amer >60 >60 mL/min   GFR calc Af Amer >60 >60 mL/min   Anion gap 9 5 - 15  CBC  Result Value Ref Range   WBC 9.4 4.0 - 10.5 K/uL   RBC 4.35 3.87 - 5.11 MIL/uL   Hemoglobin 14.5 12.0 - 15.0 g/dL   HCT 57.843.2 46.936.0 - 62.946.0 %   MCV 99.3 80.0 - 100.0 fL   MCH 33.3 26.0 - 34.0 pg   MCHC 33.6 30.0 - 36.0 g/dL   RDW 52.813.2 41.311.5 - 24.415.5 %   Platelets 225 150 - 400 K/uL    nRBC 0.0 0.0 -  0.2 %  Basic metabolic panel  Result Value Ref Range   Sodium 140 135 - 145 mmol/L   Potassium 3.3 (L) 3.5 - 5.1 mmol/L   Chloride 109 98 - 111 mmol/L   CO2 21 (L) 22 - 32 mmol/L   Glucose, Bld 104 (H) 70 - 99 mg/dL   BUN 16 8 - 23 mg/dL   Creatinine, Ser 2.95 0.44 - 1.00 mg/dL   Calcium 8.7 (L) 8.9 - 10.3 mg/dL   GFR calc non Af Amer >60 >60 mL/min   GFR calc Af Amer >60 >60 mL/min   Anion gap 10 5 - 15   I personally reviewed labs  Imaging: MRI lumbar spine: The lordotic curvature appears maintained.  There is evidence of an acute L3 vertebral body fracture with positive STIR signal that does bulge posteriorly.  There is an approximately 25% height loss.  There is epidural collection posterior to this with some mild to moderate stenosis.  There is no obvious listhesis.   Impression/Plan:  Ms. Curt Bears is here with an acute L3 compression fracture and her neurologic exam is reassuring.  There is no focal numbness, weakness, or other concerns for the stenosis seen on MRI.  Her back pain appears to be improving, however she has not been ambulating and has a planned kyphoplasty today.  I agree with this and the patient will need to be monitored for any neurologic changes.  Continue to recommend the LSO after the procedure.  If the patient should develop any changes in her lower extremities consistent with numbness, weakness, or bowel or bladder changes, we should be notified for evaluation.  Following the procedure, she will need upright x-rays with weightbearing for future monitoring.   1.  Diagnosis: L3 compression fracture  2.  Plan  -Agree with continuing for kyphoplasty plan today -Recommend continuing with the LSO brace and need upright lumbar spine x-rays following the procedure -We will reexamine the patient tomorrow after the procedure, please notify us of any acute changes

## 2018-11-25 NOTE — TOC Initial Note (Signed)
Transition of Care [facility]) - Initial/Assessment Note    Patient Details  Name: Emma Mejia MRN: [id number] Date of Birth: Oct 21, 1945  Transition of Care [facility]) CM/SW Contact:    Su Hilt, RN Phone Number: 11/25/2018, 10:29 AM  Clinical Narrative:                 Met with the patient to discuss DC plan and needs, She currently lives with her son in a single family home and has several steps to go up to get inside, she stated that she has a quad cane that she uses and has a raised toilet and grab bars. Her son provides transportation when they do go anywhere but she states that she has not been anywhere Her PCP is Dr Clent Jacks, Pharmacy is [facility], she can afford her medications ok She stated that she would like to go to rehab at [facility] and gives permission to send out for bed offers   Expected Discharge Plan: Skilled Nursing Facility Barriers to Discharge: SNF Pending bed offer, Continued Medical Work up   Patient Goals and CMS Choice Patient states their goals for this hospitalization and ongoing recovery are:: go to rehab after surgery/procedure      Expected Discharge Plan and Services Expected Discharge Plan: [place]   Discharge Planning Services: CM Consult   Living arrangements for the past 2 months: Single Family Home Expected Discharge Date: 11/26/18                        Prior Living Arrangements/Services Living arrangements for the past 2 months: Single Family Home Lives with:: Adult Children(son) Patient language and need for interpreter reviewed:: No Do you feel safe going back to the place where you live?: Yes      Need for Family Participation in Patient Care: Yes (Comment) Care giver support system in place?: Yes (comment) Current home services: DME(grab bars, quad cane raised toilet) Criminal Activity/Legal Involvement Pertinent to Current Situation/Hospitalization: No - Comment as needed  Activities of Daily Living Home  Assistive Devices/Equipment: Dentures (specify type), Eyeglasses, Shower chair without back, Walker (specify type) ADL Screening (condition at time of admission) Patient's cognitive ability adequate to safely complete daily activities?: Yes Is the patient deaf or have difficulty hearing?: No Does the patient have difficulty seeing, even when wearing glasses/contacts?: No Does the patient have difficulty concentrating, remembering, or making decisions?: No Patient able to express need for assistance with ADLs?: Yes Does the patient have difficulty dressing or bathing?: No Independently performs ADLs?: Yes (appropriate for developmental age) Does the patient have difficulty walking or climbing stairs?: Yes Weakness of Legs: Both Weakness of Arms/Hands: Both  Permission Sought/Granted Permission sought to share information with : Facility Art therapist granted to share information with : Yes, Verbal Permission Granted     Permission granted to share info w AGENCY: any SNF agency        Emotional Assessment Appearance:: Appears stated age Attitude/Demeanor/Rapport: Engaged Affect (typically observed): Accepting Orientation: : Oriented to Self, Oriented to Place, Oriented to  Time, Oriented to Situation Alcohol / Substance Use: Tobacco Use Psych Involvement: No (comment)  Admission diagnosis:  Compression fracture of L3 vertebra, initial encounter [facility]) [S32.030A] Patient Active Problem List   Diagnosis Date Noted  . Vertebral compression fracture ([place]) 11/23/2018  . Compression fracture of body of thoracic vertebra ([facility]) 07/07/2018   PCP:  Denzil Hughes, MD Pharmacy:   [facility] [address], [place] -  [place] [address] Phone: [phone] Fax: [phone]     Social Determinants of Health (SDOH) Interventions    Readmission Risk Interventions No flowsheet data found.

## 2018-11-25 NOTE — Progress Notes (Signed)
Patient was unable to ambulate yesterday despite wearing a brace.  She is wearing a brace but is still very uncomfortable even lying in bed.  On examination she is tender to palpation in the mid low lumbar spine in the midline.  She is able flex extend the toes and denies numbness in the legs.  MRI was reviewed showing L3 compression fracture with small amount of epidural hematoma.  Impression is very painful L3 compression fracture with inability to ambulate, history of prior kyphoplasty  Plan is kyphoplasty today if operating room can accommodate.  Risks discussed with patient.  Site marked.

## 2018-11-25 NOTE — Evaluation (Signed)
Physical Therapy Re-Evaluation Patient Details Name: Emma Mejia MRN: [id number] DOB: 1945/12/02 Today's Date: 11/25/2018   History of Present Illness  Patient is a 73 year old female presented to hospital s/p mechanical fall on a wet surface. Patient found to have L3 vertebral compression fracture s/p kyphoplasty 11/25/2018. Patient has PMH: t10 kyphoplasty, HTN, h/o brain aneurysm, h/o brain surgery, COPD, (+) smoker    Clinical Impression  Patient alert, oriented to self, disoriented to situation. PLOF gathered from chart review. Pt denied any pain at rest, and did not exhibit pain signs/symptoms with mobility this session. Pt was maxA from supine to sidelying, and sidelying to sit, modA to return to supine. Extended time given to improve participation as well as step by step instructions. Patient was able to sit EOB with CGA-minA for 1-12minutes, adamant about returning to supine throughout sitting despite redirection.  Overall the patient demonstrated deficits (see "PT Problem List") that impede the patient's functional abilities, safety, and mobility and would benefit from skilled PT intervention. Recommendation is STR due to current level of assistance needed.      Follow Up Recommendations SNF    Equipment Recommendations  Other (comment)(TBD)    Recommendations for Other Services       Precautions / Restrictions Precautions Precautions: Fall;Back Precaution Comments: TLSO brace at all times Required Braces or Orthoses: Other Brace Other Brace: TLSO brace at all times Restrictions Weight Bearing Restrictions: No      Mobility  Bed Mobility Overal bed mobility: Needs Assistance Bed Mobility: Rolling;Sidelying to Sit Rolling: Max assist Sidelying to sit: Max assist       General bed mobility comments: Pt agreeable to attempt to sit up, maxA to complete, despite extended time given and step by step instruction  Transfers                 General transfer  comment: Patient refused further mobility despite several attempts at redirection  Ambulation/Gait                Stairs            Wheelchair Mobility    Modified Rankin (Stroke Patients Only)       Balance Overall balance assessment: Needs assistance Sitting-balance support: Feet supported Sitting balance-Leahy Scale: Poor                                       Pertinent Vitals/Pain Pain Assessment: No/denies pain    Home Living Family/patient expects to be discharged to:: Private residence Living Arrangements: Children Available Help at Discharge: Family Type of Home: House Home Access: Stairs to enter Entrance Stairs-Rails: Right;Left;Can reach both Secretary/administrator of Steps: 4-5 Home Layout: One level Home Equipment: Grab bars - toilet;Cane - quad      Prior Function Level of Independence: Needs assistance         Comments: Patient states that she has been needing assistance for community activities and some ADLs but does not offer any further insights.     Hand Dominance        Extremity/Trunk Assessment   Upper Extremity Assessment Upper Extremity Assessment: Generalized weakness    Lower Extremity Assessment Lower Extremity Assessment: Generalized weakness    Cervical / Trunk Assessment Cervical / Trunk Assessment: Other exceptions Cervical / Trunk Exceptions: s/p kyphoplasty  Communication   Communication: No difficulties  Cognition Arousal/Alertness: Awake/alert Behavior During Therapy: WFL for  tasks assessed/performed Overall Cognitive Status: No family/caregiver present to determine baseline cognitive functioning                                        General Comments      Exercises     Assessment/Plan    PT Assessment Patient needs continued PT services  PT Problem List Decreased strength;Decreased mobility;Decreased range of motion;Decreased activity tolerance;Decreased  balance;Decreased knowledge of use of DME;Decreased knowledge of precautions;Decreased safety awareness;Pain       PT Treatment Interventions DME instruction;Functional mobility training;Balance training;Patient/family education;Gait training;Therapeutic activities;Neuromuscular re-education;Stair training;Therapeutic exercise    PT Goals (Current goals can be found in the Care Plan section)  Acute Rehab PT Goals Patient Stated Goal: to rest PT Goal Formulation: With patient Time For Goal Achievement: 12/09/18 Potential to Achieve Goals: Fair    Frequency 7X/week   Barriers to discharge Decreased caregiver support      Co-evaluation               AM-PAC PT "6 Clicks" Mobility  Outcome Measure Help needed turning from your back to your side while in a flat bed without using bedrails?: A Lot Help needed moving from lying on your back to sitting on the side of a flat bed without using bedrails?: Total Help needed moving to and from a bed to a chair (including a wheelchair)?: Total Help needed standing up from a chair using your arms (e.g., wheelchair or bedside chair)?: Total Help needed to walk in hospital room?: Total Help needed climbing 3-5 steps with a railing? : Total 6 Click Score: 7    End of Session Equipment Utilized During Treatment: Back brace;Oxygen(2L) Activity Tolerance: Patient limited by pain;Patient limited by fatigue Patient left: in bed;with nursing/sitter in room Nurse Communication: Mobility status PT Visit Diagnosis: Muscle weakness (generalized) (M62.81);Unsteadiness on feet (R26.81);Difficulty in walking, not elsewhere classified (R26.2);History of falling (Z91.81);Pain Pain - Right/Left: (low) Pain - part of body: (back)    Time: [id number] PT Time Calculation (min) (ACUTE ONLY): 11 min   Charges:   PT Evaluation $PT Re-evaluation: 1 Re-eval         Olga Coasteriana Roselie Cirigliano PT, DPT [phone] PM,11/25/18 [phone]

## 2018-11-25 NOTE — Transfer of Care (Signed)
Immediate Anesthesia Transfer of Care Note  Patient: Emma Mejia  Procedure(s) Performed: KYPHOPLASTY L3 (N/A )  Patient Location: PACU  Anesthesia Type:General  Level of Consciousness: sedated  Airway & Oxygen Therapy: Patient Spontanous Breathing and Patient connected to face mask oxygen  Post-op Assessment: Report given to RN and Post -op Vital signs reviewed and stable  Post vital signs: Reviewed and stable  Last Vitals:  Vitals Value Taken Time  BP 141/82   Temp    Pulse 106 11/25/2018 12:52 PM  Resp 18 11/25/2018 12:52 PM  SpO2 96 % 11/25/2018 12:52 PM  Vitals shown include unvalidated device data.  Last Pain:  Vitals:   11/25/18 1248  TempSrc:   PainSc: Asleep         Complications: No apparent anesthesia complications

## 2018-11-26 MED ORDER — OXYCODONE HCL 5 MG PO TABS: 5.0000 mg | ORAL_TABLET | Freq: Four times a day (QID) | ORAL | 0 refills | Status: DC | PRN

## 2018-11-26 MED ORDER — OXYCODONE HCL 5 MG PO TABS: 5.0000 mg | ORAL_TABLET | Freq: Four times a day (QID) | ORAL | Status: DC | PRN

## 2018-11-26 MED ORDER — GUAIFENESIN-DM 100-10 MG/5ML PO SYRP
5.0000 mL | ORAL_SOLUTION | ORAL | Status: DC | PRN
  Administered 2018-11-26 (×2): 5 mL via ORAL
  Filled 2018-11-26 (×2): qty 5

## 2018-11-26 MED ORDER — SENNOSIDES-DOCUSATE SODIUM 8.6-50 MG PO TABS: 1.0000 | ORAL_TABLET | Freq: Every evening | ORAL | Status: DC | PRN

## 2018-11-26 MED ORDER — ACETAMINOPHEN 325 MG PO TABS: 650.0000 mg | ORAL_TABLET | Freq: Four times a day (QID) | ORAL | Status: DC | PRN

## 2018-11-26 MED ORDER — METHOCARBAMOL 500 MG PO TABS: 500.0000 mg | ORAL_TABLET | Freq: Three times a day (TID) | ORAL | 0 refills | Status: DC | PRN

## 2018-11-26 MED ORDER — GUAIFENESIN-DM 100-10 MG/5ML PO SYRP: 5.0000 mL | ORAL_SOLUTION | ORAL | 0 refills | Status: DC | PRN

## 2018-11-26 MED ORDER — NICOTINE 21 MG/24HR TD PT24: 21.0000 mg | MEDICATED_PATCH | Freq: Every day | TRANSDERMAL | 0 refills | Status: DC

## 2018-11-26 MED ORDER — AMLODIPINE BESYLATE 5 MG PO TABS: 5.0000 mg | ORAL_TABLET | Freq: Every day | ORAL | Status: DC

## 2018-11-26 MED ORDER — DOCUSATE SODIUM 100 MG PO CAPS: 100.0000 mg | ORAL_CAPSULE | Freq: Two times a day (BID) | ORAL | 0 refills | Status: DC

## 2018-11-26 NOTE — TOC Transition Note (Signed)
Transition of Care [facility]) - CM/SW Discharge Note   Patient Details  Name: Afsana Shaulis MRN: [id number] Date of Birth: 28-May-1946  Transition of Care [facility]) CM/SW Contact:  Barrie Dunker, RN Phone Number: 11/26/2018, 10:27 AM   Clinical Narrative:    Patient to DC to [facility] room 712 after 1 PM Called and spoke to the daughter [name] at [phone], she will go to [facility] and fill out any paper work as well as take the patient some clothing to the main desk  The bed side nurse is to call report to [phone] The bedside nurse is to call EMS for transport  The DC packet is on the chart   Final next level of care: Skilled Nursing Facility Barriers to Discharge: Barriers Resolved   Patient Goals and CMS Choice Patient states their goals for this hospitalization and ongoing recovery are:: go to rehab after surgery/procedure      Discharge Placement                       Discharge Plan and Services   Discharge Planning Services: CM Consult                      Social Determinants of Health (SDOH) Interventions     Readmission Risk Interventions No flowsheet data found.

## 2018-11-26 NOTE — Discharge Instructions (Signed)
Follow-up with primary care physician in 3 days Follow-up with Dr. Rosita Kea in a week

## 2018-11-26 NOTE — Progress Notes (Signed)
I evaluated Ms. Emma Mejia again this morning. She states her back pain is better and she is in the brace. She denies still any leg pain, numbness, or weakness.  Exam: 5/5 strength in hip flexion, knee flexion, dorsiflexion, plantarflexion Intact to light touch throughout  Plan: Recommend upright xrays lumbar spine for future comparison. Otherwise, no neurosurgery treatment needed, can follow-up with Dr Rosita Kea

## 2018-11-26 NOTE — Progress Notes (Signed)
Physical Therapy Treatment Patient Details Name: Jamae BellaDrucie Opitznger MRN: [id number] DOB: 1946-02-07 Today's Date: 11/26/2018    History of Present Illness Patient is a 73 year old female presented to hospital s/p mechanical fall on a wet surface. Patient found to have L3 vertebral compression fracture s/p kyphoplasty 11/25/2018. Patient has PMH: t10 kyphoplasty, HTN, h/o brain aneurysm, h/o brain surgery, COPD, (+) smoker    PT Comments    Patient reclining in bed on RA. Agreed to participate in physical therapy; reported no pain at rest. Patient tolerated treatment fair and is making slight progress towards goals at this point, demonstrating improved sitting balance since last session. Performed supine to sit mod A with log roll technique and step by step cuing and tactile assistance with prolonged time. O2 sat 88% in sitting. Attempted breathing techniques and incentive spirometer with no lasting improvement. Consulted nursing and got permission to apply 2L O2 which brought sat up to between 89-91% throughout remainder of session. Patient required min A occasionally to maintain trunk stability in sitting. Requested to rest and declined to attempt transfers several times so EOB exercise and supine exercise completed. Patient fatigued quickly, required prolonged time to respond to cuing, and demonstrated slow and weak performance of exercises. She reported gradually increasing back pain up to 8/10 that resolved when returned to bed. O2 sat 90% on 2L O2 and HR 90 bpm reclining in bed at end of session. Nursing notified of results. Patient would benefit from continued physical therapy to address remaining impairments and functional limitations to work towards stated goals and return to PLOF or maximal functional independence.    Follow Up Recommendations  SNF     Equipment Recommendations  Other (comment)(TBD)    Recommendations for Other Services       Precautions / Restrictions  Precautions Precautions: Fall;Back Precaution Comments: TLSO brace when OOB if it improves pt comfort.  Required Braces or Orthoses: Other Brace Other Brace: TLSO brace when OOB if it improves pt comfort.  Restrictions Weight Bearing Restrictions: Yes Other Position/Activity Restrictions: WBAT    Mobility  Bed Mobility Overal bed mobility: Needs Assistance Bed Mobility: Rolling;Sidelying to Sit Rolling: Mod assist Sidelying to sit: Mod assist       General bed mobility comments: pt required step by step cuing and tactile cuing for log roll technique. Able to complete with mod A with extended time.   Transfers Overall transfer level: Needs assistance               General transfer comment: Patient refused further mobility despite several attempts at redirection.   Ambulation/Gait             General Gait Details: Unable to assess at this time due to limitations in willingness to attempt transfer.   Stairs             Wheelchair Mobility    Modified Rankin (Stroke Patients Only)       Balance Overall balance assessment: Needs assistance Sitting-balance support: Feet supported;Bilateral upper extremity supported Sitting balance-Leahy Scale: Fair Sitting balance - Comments: patient able to sit at edge of bed for 10-15 minutes with occasional support to prevent backward lean. Fatigued quickly and required BUE support.  Postural control: Posterior lean   Standing balance-Leahy Scale: Zero Standing balance comment: patient declined to attempt standing after repeated encouragement and attempts to re-direct.  Cognition                                              Exercises Other Exercises Other Exercises: seated at EOB: x10 reps of each exercise each side: LAQ, heel/toe raises, hip adduction, hip abduction, marching.  Other Exercises: Supine exercises: SLR x 10 left, 2x5 R. Heel slides x2 each  side.  Other Exercises: Scoot up bed with max A.  Other Exercises: Incentive spirometer breathing exercises 2x5 with cuing and instruction on proper use. O2 sat went to 85% during use but increased to 91% breifly following     General Comments        Pertinent Vitals/Pain Pain Assessment: 0-10 Pain Location: back. reports 0/10 at rest, 1/10 at start of mobility, 8/10 after prolonged EOB sitting. comfortable again when resting in bed end of session Pain Descriptors / Indicators: Grimacing;Guarding Pain Intervention(s): Limited activity within patient's tolerance;Monitored during session    Home Living                      Prior Function            PT Goals (current goals can now be found in the care plan section) Acute Rehab PT Goals Patient Stated Goal: go to my son's house PT Goal Formulation: With patient Time For Goal Achievement: 12/08/18 Potential to Achieve Goals: Fair Progress towards PT goals: Progressing toward goals(continues to decline to attempt transfers, trunk stability improved slightly)    Frequency    7X/week      PT Plan Current plan remains appropriate    Co-evaluation              AM-PAC PT "6 Clicks" Mobility   Outcome Measure  Help needed turning from your back to your side while in a flat bed without using bedrails?: A Lot Help needed moving from lying on your back to sitting on the side of a flat bed without using bedrails?: A Lot Help needed moving to and from a bed to a chair (including a wheelchair)?: Total Help needed standing up from a chair using your arms (e.g., wheelchair or bedside chair)?: Total Help needed to walk in hospital room?: Total Help needed climbing 3-5 steps with a railing? : Total 6 Click Score: 8    End of Session Equipment Utilized During Treatment: Back brace;Oxygen(No O2 applied at start, got permission from nsg to incr. 2L when satting at 87%. Incr. to 89-91% c 2L ) Activity Tolerance: Patient  limited by pain;Patient limited by fatigue Patient left: in bed;with call bell/phone within reach;with bed alarm set Nurse Communication: Mobility status(O2 status, got permission to increase, results of session) PT Visit Diagnosis: Muscle weakness (generalized) (M62.81);Unsteadiness on feet (R26.81);Difficulty in walking, not elsewhere classified (R26.2);History of falling (Z91.81);Pain Pain - Right/Left: (1/10-8/10 with mobility) Pain - part of body: (back)     Time: 8502-7741 PT Time Calculation (min) (ACUTE ONLY): 33 min  Charges:  $Therapeutic Exercise: 8-22 mins $Therapeutic Activity: 8-22 mins                     Luretha Murphy. Ilsa Iha, PT, DPT 11/26/18, 10:34 AM

## 2018-11-26 NOTE — TOC Progression Note (Signed)
Transition of Care [facility]) - Progression Note    Patient Details  Name: Emma Mejia MRN: [id number] Date of Birth: 09-23-1945  Transition [facility]) CM/SW Contact  Barrie Dunker, RN Phone Number: 11/26/2018, 10:13 AM  Clinical Narrative:    Spoke with the patient and reviewed the bed offers for SNF She chose [facility]  I called and left a VM for Ladene Artist the son about the patient choosing to go to [facility] Left my contact information for a call back,    Expected Discharge Plan: Skilled Nursing Facility Barriers to Discharge: SNF Pending bed offer, Continued Medical Work up  Expected Discharge Plan and Services Expected Discharge Plan: Skilled Nursing Facility   Discharge Planning Services: CM Consult   Living arrangements for the past 2 months: Single Family Home Expected Discharge Date: 11/26/18                         Social Determinants of Health (SDOH) Interventions    Readmission Risk Interventions No flowsheet data found.

## 2018-11-26 NOTE — Progress Notes (Signed)
Care of patient taken over from [place], [place].  Patient awaiting transport to [facility] via EMS.  Patient resting comfortably.  Orson Ape, BSN

## 2018-11-26 NOTE — Discharge Summary (Signed)
[facility]icians - [place] [facility]   PATIENT NAME: Emma Mejia    MR#:  [id number]  DATE OF BIRTH:  [birth year]  DATE OF ADMISSION:  11/23/2018 ADMITTING PHYSICIAN: Ihor Austin, MD  DATE OF DISCHARGE: 11/26/2018  PRIMARY CARE PHYSICIAN: Bertram Savin, MD    ADMISSION DIAGNOSIS:  Compression fracture of L3 vertebra, initial encounter (HCC) [S32.030A]  DISCHARGE DIAGNOSIS:  Active Problems:   Vertebral compression fracture (HCC)   SECONDARY DIAGNOSIS:   Past Medical History:  Diagnosis Date  . Brain aneurysm   . Hypertension   . Thyroid disease     HOSPITAL COURSE:    73 year old female with a history of essential hypertension, brain aneurysm and previous kyphoplasty for vertebral fracture who presented after mechanical fall and found to have acute L3 compression fracture.  1.  L3 compression fracture resulting in severe back pain: Patient evaluated by orthopedic surgery.   Patient had a kyphoplasty by Dr. Rosita Kea on 11/25/2018.  Patient tolerated procedure well.  Okay to discharge patient from Dr. Rosita Kea and outpatient follow-up in a week  continue pain control and physical therapy recommending skilled nursing facility.  Continue LSO brace as per recommendations by orthopedics. ISS ordered to prevent PNA.   2.  Accelerated hypertension in the setting of pain: Significantly improved continue lisinopril and metoprolol.  Add low-dose Norvasc.   Continue pain control  3.  Hypothyroidism: Continue Synthroid  4.  Hypokalemia: Replete prn   5.Tobacco dependence: Patient is encouraged to quit smoking and willing to attempt to quit was assessed. Patient highly motivated.Counseling was provided She will continue nicotine patch.  6.  Generalized weakness physical therapy recommending skilled nursing facility .  Patient is getting transferred to [facility] skilled nursing facility patient and family are agreeable DISCHARGE CONDITIONS:    fair  CONSULTS OBTAINED:  Treatment Team:  Donato Heinz, MD   PROCEDURES kyphoplasty  DRUG ALLERGIES:   Allergies  Allergen Reactions  . Ibuprofen   . Naproxen     DISCHARGE MEDICATIONS:   Allergies as of 11/26/2018      Reactions   Ibuprofen    Naproxen       Medication List    TAKE these medications   acetaminophen 325 MG tablet Commonly known as:  TYLENOL Take 2 tablets (650 mg total) by mouth every 6 (six) hours as needed for mild pain (or Fever >/= 101). What changed:    medication strength  how much to take  reasons to take this   alendronate 70 MG tablet Commonly known as:  FOSAMAX Take 70 mg by mouth once a week. Take with a full glass of water on an empty stomach.   amLODipine 5 MG tablet Commonly known as:  NORVASC Take 1 tablet (5 mg total) by mouth daily. Start taking on:  November 27, 2018   aspirin EC 81 MG tablet Take 81 mg by mouth daily.   cholecalciferol 25 MCG (1000 UT) tablet Commonly known as:  VITAMIN D Take 1,000 Units by mouth daily.   diclofenac sodium 1 % Gel Commonly known as:  VOLTAREN Apply 2 g topically 4 (four) times daily.   docusate sodium 100 MG capsule Commonly known as:  COLACE Take 1 capsule (100 mg total) by mouth 2 (two) times daily.   fluticasone 27.5 MCG/SPRAY nasal spray Commonly known as:  VERAMYST Place 2 sprays into the nose daily.   guaiFENesin-dextromethorphan 100-10 MG/5ML syrup Commonly known as:  ROBITUSSIN DM Take 5 mLs by  mouth every 4 (four) hours as needed for cough.   latanoprost 0.005 % ophthalmic solution Commonly known as:  XALATAN Place 1 drop into both eyes Nightly.   levothyroxine 75 MCG tablet Commonly known as:  SYNTHROID Take 75 mcg by mouth daily before breakfast.   lisinopril 20 MG tablet Commonly known as:  ZESTRIL Take 40 mg by mouth daily.   methocarbamol 500 MG tablet Commonly known as:  ROBAXIN Take 1 tablet (500 mg total) by mouth every 8 (eight) hours as  needed for muscle spasms.   metoprolol succinate 100 MG 24 hr tablet Commonly known as:  TOPROL-XL Take 100 mg by mouth daily.   nicotine 21 mg/24hr patch Commonly known as:  NICODERM CQ - dosed in mg/24 hours Place 1 patch (21 mg total) onto the skin daily.   oxyCODONE 5 MG immediate release tablet Commonly known as:  Oxy IR/ROXICODONE Take 1 tablet (5 mg total) by mouth every 6 (six) hours as needed for moderate pain, severe pain or breakthrough pain.   pantoprazole 20 MG tablet Commonly known as:  PROTONIX Take 20 mg by mouth daily.   senna-docusate 8.6-50 MG tablet Commonly known as:  Senokot-S Take 1 tablet by mouth at bedtime as needed for mild constipation.        DISCHARGE INSTRUCTIONS:   Follow-up with primary care physician in 3 days Follow-up with Dr. Rosita Kea in a week   DIET:  Cardiac diet  DISCHARGE CONDITION:  Stable  ACTIVITY:  Activity as tolerated per PT  OXYGEN:  Home Oxygen: Yes.     Oxygen Delivery: 2 liters/min via Patient connected to nasal cannula oxygen  DISCHARGE LOCATION:  nursing home   If you experience worsening of your admission symptoms, develop shortness of breath, life threatening emergency, suicidal or homicidal thoughts you must seek medical attention immediately by calling 911 or calling your MD immediately  if symptoms less severe.  You Must read complete instructions/literature along with all the possible adverse reactions/side effects for all the Medicines you take and that have been prescribed to you. Take any new Medicines after you have completely understood and accpet all the possible adverse reactions/side effects.   Please note  You were cared for by a hospitalist during your hospital stay. If you have any questions about your discharge medications or the care you received while you were in the hospital after you are discharged, you can call the unit and asked to speak with the hospitalist on call if the hospitalist that  took care of you is not available. Once you are discharged, your primary care physician will handle any further medical issues. Please note that NO REFILLS for any discharge medications will be authorized once you are discharged, as it is imperative that you return to your primary care physician (or establish a relationship with a primary care physician if you do not have one) for your aftercare needs so that they can reassess your need for medications and monitor your lab values.     Today  Chief Complaint  Patient presents with  . Fall  . Hip Pain   Patient is feeling better.  Reevaluated by physical therapy recommending skilled nursing facilitY.  Patient and family members are  agreeable  ROS:  CONSTITUTIONAL: Denies fevers, chills. Denies any fatigue, weakness.  EYES: Denies blurry vision, double vision, eye pain. EARS, NOSE, THROAT: Denies tinnitus, ear pain, hearing loss. RESPIRATORY: Denies cough, wheeze, shortness of breath.  CARDIOVASCULAR: Denies chest pain, palpitations, edema.  GASTROINTESTINAL:  Denies nausea, vomiting, diarrhea, abdominal pain. Denies bright red blood per rectum. GENITOURINARY: Denies dysuria, hematuria. ENDOCRINE: Denies nocturia or thyroid problems. HEMATOLOGIC AND LYMPHATIC: Denies easy bruising or bleeding. SKIN: Denies rash or lesion. MUSCULOSKELETAL: Back pain significantly improved denies pain in  shoulder, knees, hips or arthritic symptoms.  NEUROLOGIC: Denies paralysis, paresthesias.  PSYCHIATRIC: Denies anxiety or depressive symptoms.   VITAL SIGNS:  Blood pressure 128/72, pulse 89, temperature 98.1 F (36.7 C), temperature source Oral, resp. rate 18, height  (1.651 m), weight 54 kg, SpO2 98 %.  I/O:    Intake/Output Summary (Last 24 hours) at 11/26/2018 1146 Last data filed at 11/26/2018 0432 Gross per 24 hour  Intake 803 ml  Output 100 ml  Net 703 ml    PHYSICAL EXAMINATION:  GENERAL:  73 y.o.-year-old patient lying in the bed  with no acute distress.  EYES: Pupils equal, round, reactive to light and accommodation. No scleral icterus. Extraocular muscles intact.  HEENT: Head atraumatic, normocephalic. Oropharynx and nasopharynx clear.  NECK:  Supple, no jugular venous distention. No thyroid enlargement, no tenderness.  LUNGS: Normal breath sounds bilaterally, no wheezing, rales,rhonchi or crepitation. No use of accessory muscles of respiration.  CARDIOVASCULAR: S1, S2 normal. No murmurs, rubs, or gallops.  ABDOMEN: Soft, non-tender, non-distended. Bowel sounds present. No organomegaly or mass.  EXTREMITIES: No pedal edema, cyanosis, or clubbing.  NEUROLOGIC: Awake alert and oriented x3 sensation intact. Gait not checked.  PSYCHIATRIC: The patient is alert and oriented x 3.  SKIN: No obvious rash, lesion, or ulcer.   DATA REVIEW:   CBC Recent Labs  Lab 11/24/18 0528  WBC 9.4  HGB 14.5  HCT 43.2  PLT 225    Chemistries  Recent Labs  Lab 11/25/18 0505  NA 140  K 3.3*  CL 109  CO2 21*  GLUCOSE 104*  BUN 16  CREATININE 0.57  CALCIUM 8.7*    Cardiac Enzymes Recent Labs  Lab 11/23/18 1208  TROPONINI 0.04*    Microbiology Results  No results found for this or any previous visit.  RADIOLOGY:  Dg Lumbar Spine 2-3 Views  Result Date: 11/25/2018 CLINICAL DATA:  L3 kyphoplasty. EXAM: LUMBAR SPINE - 2-3 VIEW; DG C-ARM 61-120 MIN Radiation exposure index: 37.33 mGy. COMPARISON:  MRI of November 23, 2018. FINDINGS: Two intraoperative fluoroscopic images were obtained of the lumbar spine. These demonstrate the patient to be status post L3 kyphoplasty. IMPRESSION: Fluoroscopic guidance provided during L3 kyphoplasty. Electronically Signed   By: Lupita Raider M.D.   On: 11/25/2018 12:56   Ct Lumbar Spine Wo Contrast  Result Date: 11/23/2018 CLINICAL DATA:  Back pain status post fall off the porch. Decreased sensation in the right leg EXAM: CT LUMBAR SPINE WITHOUT CONTRAST TECHNIQUE: Multidetector CT  imaging of the lumbar spine was performed without intravenous contrast administration. Multiplanar CT image reconstructions were also generated. 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 Please refer to the enumerated sequence prior to any intervention. Alignment: Normal. Vertebrae: Chronic L4 vertebral body compression fracture with approximately 40% height loss. Chronic L5 vertebral body compression fracture with approximately 50% central height loss. Acute L3 vertebral body compression fracture with approximately 20% height loss and disruption of the posterior cortex. Remainder the vertebral body heights are maintained. Generalized osteopenia. No aggressive osseous lesion. Paraspinal and other soft tissues: No acute paraspinal abnormality. Abdominal aortic atherosclerosis. Disc levels: Disc spaces are maintained. T12-L1: No disc protrusion or foraminal stenosis.  Mild bilateral facet arthropathy. L1-L2: No disc protrusion. Mild bilateral facet arthropathy. No foraminal stenosis. L2-L3: Mild broad-based disc bulge. Mild bilateral facet arthropathy. No foraminal stenosis. L3-L4: Minimal broad-based disc bulge. Mild bilateral facet arthropathy. No foraminal stenosis. L4-L5: Broad-based disc bulge. Moderate bilateral facet arthropathy. No foraminal stenosis. L5-S1: Broad-based disc bulge. Moderate bilateral facet arthropathy. Mild bilateral foraminal narrowing. IMPRESSION: 1. Acute mild burst compression fracture of the L3 vertebral body with approximately 20% height loss. No significant retropulsion of vertebral body. Electronically Signed   By: Elige Ko   On: 11/23/2018 12:02   Mr Lumbar Spine Wo Contrast  Result Date: 11/23/2018 CLINICAL DATA:  Status post fall, right leg decreased sensation EXAM: MRI LUMBAR SPINE WITHOUT CONTRAST TECHNIQUE: Multiplanar, multisequence MR imaging of the lumbar spine was performed. No  intravenous contrast was administered. 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 Please refer to the enumerated sequence prior to any intervention. Alignment:  Physiologic. Vertebrae:  Chronic L4 and L5 vertebral body compression fractures. Acute L3 vertebral body compression fracture with marrow edema throughout the vertebral body and approximately 20% height loss. Fracture involves the posterior cortex of the L3 vertebral body with 3 mm of retropulsion focally. Epidural hematoma emanating from the posterior fracture cleft and extending cephalad with the overall epidural hematoma measuring 9 x 13 x 32 mm and resulting in moderate-severe spinal stenosis at L2-3. No discitis or osteomyelitis.  No aggressive osseous lesion. Conus medullaris and cauda equina: Conus extends to the L1 level. Conus and cauda equina appear normal. Paraspinal and other soft tissues: Edema in the left psoas muscle with expansion likely reflecting muscle strain. Disc levels: Disc spaces: Degenerative disc disease with disc desiccation at L4-5 and L5-S1. T12-L1: Minimal broad-based disc bulge. No evidence of neural foraminal stenosis. No central canal stenosis. L1-L2: No significant disc bulge. No evidence of neural foraminal stenosis. No central canal stenosis. L2-L3: No significant disc bulge. Epidural hemorrhage with moderate-severe spinal stenosis. No evidence of neural foraminal stenosis. L3-L4: Mild broad-based disc bulge. Small epidural hemorrhage and mild spinal stenosis. No evidence of neural foraminal stenosis. L4-L5: Minimal broad-based disc bulge. Moderate left foraminal narrowing. No right foraminal narrowing. No central canal stenosis. L5-S1: Mild broad-based disc bulge. Mild bilateral facet arthropathy. Moderate left foraminal stenosis. No central canal stenosis. IMPRESSION: 1. Acute L3 vertebral body compression fracture with marrow edema  throughout the vertebral body and approximately 20% height loss. Fracture involves the posterior cortex of the L3 vertebral body with 3 mm of retropulsion focally. Acute epidural hematoma emanating from the posterior fracture cleft and extending cephalad with the overall epidural hematoma measuring 9 x 13 x 32 mm and resulting in moderate-severe spinal stenosis at L2-3. Electronically Signed   By: Elige Ko   On: 11/23/2018 15:13   Dg Chest Portable 1 View  Result Date: 11/23/2018 CLINICAL DATA:  Pain after fall EXAM: PORTABLE CHEST 1 VIEW COMPARISON:  None. FINDINGS: Cardiomegaly. Previous vertebroplasty of a lower thoracic vertebral body. Mild atelectasis in the lateral left lung base. The hila and mediastinum are normal. No other acute abnormalities. IMPRESSION: No active disease. Electronically Signed   By: Gerome Sam III M.D   On: 11/23/2018 12:23   Dg C-arm 1-60 Min  Result Date: 11/25/2018 CLINICAL DATA:  L3 kyphoplasty. EXAM: LUMBAR SPINE - 2-3 VIEW; DG C-ARM 61-120 MIN Radiation exposure index: 37.33 mGy. COMPARISON:  MRI of November 23, 2018. FINDINGS: Two intraoperative fluoroscopic images were  obtained of the lumbar spine. These demonstrate the patient to be status post L3 kyphoplasty. IMPRESSION: Fluoroscopic guidance provided during L3 kyphoplasty. Electronically Signed   By: Lupita RaiderJames  Green Jr M.D.   On: 11/25/2018 12:56   Dg Hip Unilat W Or Wo Pelvis 2-3 Views Right  Result Date: 11/23/2018 CLINICAL DATA:  Pain after fall. EXAM: DG HIP (WITH OR WITHOUT PELVIS) 2-3V RIGHT COMPARISON:  None. FINDINGS: There is no evidence of hip fracture or dislocation. There is no evidence of arthropathy or other focal bone abnormality. IMPRESSION: Negative. Electronically Signed   By: Gerome Samavid  Williams III M.D   On: 11/23/2018 12:23    EKG:   Orders placed or performed during the hospital encounter of 11/23/18  . EKG 12-Lead  . EKG 12-Lead      Management plans discussed with the patient,  family and they are in agreement.  CODE STATUS:     Code Status Orders  (From admission, onward)         Start     Ordered   11/23/18 1709  Full code  Continuous     11/23/18 1708        Code Status History    Date Active Date Inactive Code Status Order ID Comments User Context   07/07/2018 1500 07/10/2018 1540 Full Code [id number]  Enedina FinnerPatel, Sona, MD Inpatient    Advance Directive Documentation     Most Recent Value  Type of Advance Directive  Healthcare Power of Attorney  Pre-existing out of facility DNR order (yellow form or pink MOST form)  -  "MOST" Form in Place?  -      TOTAL TIME TAKING CARE OF THIS PATIENT: 43 minutes.   Note: This dictation was prepared with Dragon dictation along with smaller phrase technology. Any transcriptional errors that result from this process are unintentional.   @[facility] @  on 11/26/2018 at 11:46 AM  Between 7am to 6pm - Pager - [phone]  After 6pm go to [email or web address] - password [email or web address]  [facility]  Office  [phone]  CC: Primary care physician; Bertram SavinFeltner, Cynthia K, MD

## 2018-11-26 NOTE — Care Management Important Message (Signed)
Important Message  Patient Details  Name: Emma Mejia MRN: [id number] Date of Birth: 11-Apr-1946   Medicare Important Message Given:  Yes    Olegario Messier A [name] 11/26/2018, 11:00 AM

## 2018-11-26 NOTE — TOC Progression Note (Signed)
Transition of Care [facility] - Progression Note    Patient Details  Name: Emma Mejia MRN: [id number] Date of Birth: 1946/04/18  Transition of [facility]) CM/SW Contact  Barrie Dunker, RN Phone Number: 11/26/2018, 10:06 AM  Clinical Narrative:    Called [facility] to verify that they do have a bed before offering to the patient, Inetta Fermo verified that yes they do have 2 beds available.  Review the offers with the patient   Expected Discharge Plan: Skilled Nursing Facility Barriers to Discharge: SNF Pending bed offer, Continued Medical Work up  Expected Discharge Plan and Services Expected Discharge Plan: Skilled Nursing Facility   Discharge Planning Services: CM Consult   Living arrangements for the past 2 months: Single Family Home Expected Discharge Date: 11/26/18                         Social Determinants of Health (SDOH) Interventions    Readmission Risk Interventions No flowsheet data found.

## 2018-12-10 ENCOUNTER — Other Ambulatory Visit: Payer: Medicare Other | Admitting: Nurse Practitioner

## 2018-12-10 ENCOUNTER — Other Ambulatory Visit: Payer: Self-pay

## 2018-12-11 ENCOUNTER — Other Ambulatory Visit: Payer: Self-pay

## 2018-12-11 ENCOUNTER — Non-Acute Institutional Stay: Payer: Medicare Other | Admitting: Primary Care

## 2018-12-11 DIAGNOSIS — Z515 Encounter for palliative care: Secondary | ICD-10-CM

## 2018-12-11 NOTE — Progress Notes (Signed)
Therapist, nutritional Palliative Care Consult Note Telephone: [phone]  Fax: [phone]   TELEHEALTH VISIT STATEMENT Due to the COVID-19 crisis, this visit was done via telemedicine from my office. It was initiated and consented to by this patient and/or family.  PATIENT NAME: Emma Mejia DOB: 12/27/1945 MRN: [id number]  PRIMARY CARE PROVIDER:   Dorothey Baseman, MD  REFERRING PROVIDER:  Dorothey Baseman, MD [address] [place] [address]  RESPONSIBLE PARTY:   Extended Emergency Contact Information Primary Emergency Contact: Rejoice, [place] Mobile Phone: [phone] Relation: Son Secondary Emergency Contact: Macady, [place] Mobile Phone: [phone] Relation: Daughter Preferred language: English   Palliative Care was asked to follow patient by consultation request of Dr. Dorothey Baseman, MD . This is a follow up visit by telemedicine with Selena Batten, RN at [facility], myself and Emma Mejia.  ASSESSMENT and RECOMMENDATIONS:   1. Nutrition: Nutritional supplements per facility availability. 121 lb is current wt.   Taking reg. diet with poor intake (< 25% most meals) but needs supplementation to  Prevent wt loss.  2. Goals of care: Full Code. Makes own medical decisions.Plans to return to home with son at his home; d/c date not known.   3. COPD:  Manage with combivent inhaler qid. Consider nicotine patch taper. Smoker x 2 packs a day, pack years not available. Has expectorant and suppressant prn.  Had nicotine patches on for 2 weeks but have stopped. Usual dose is 21 mg. X 6 weeks, then weaning if desired.  4. Pain: Consider gabapentin for c/o neuropathic pain in right flank and down leg. Continue current somatic pain regimen as she has controlled somatic pain. Has oxycodone 5 mg q 6 h prn pain, which she's used 1-2x/ day but been taking plain acetaminophen and robaxin. Had fall, L3 fx and kyphoplasty.  5. Mobililty: Continue PT at SNF, with home health  referral for continued therapy  follow up at home. Pt is walking with PT and gets up with 1 assist and uses w/c and locks and transfers self. Has walker at home and was walking there, now ambulating in w/c.  Discussed getting help at home as son and daughter in law work. I urged her to discuss with her son so that on her d/c she has what she needs re caregivers at  Home.  Palliative care will continue to follow for goals of care clarification and symptom management. Return 3 weeks or prn.  I spent 25 minutes providing this consultation,  from 1230 to 1255. More than 50% of the time in this consultation was spent coordinating communication.   HISTORY OF PRESENT ILLNESS:  Yarlin Laureano is a 73 y.o. year old female with multiple medical problems including HTN, thyroid disease,COPD, compression fracture of thoracic vertebra. Palliative Care was asked to help address goals of care.   CODE STATUS: FULL  PPS: 40% HOSPICE ELIGIBILITY/DIAGNOSIS: TBD  PAST MEDICAL HISTORY:  Past Medical History:  Diagnosis Date  . Brain aneurysm   . Hypertension   . Thyroid disease     SOCIAL HX:  Social History   Tobacco Use  . Smoking status: Current Every Day Smoker  . Smokeless tobacco: Never Used  Substance Use Topics  . Alcohol use: Not Currently    ALLERGIES:  Allergies  Allergen Reactions  . Ibuprofen   . Naproxen      PERTINENT MEDICATIONS:  Outpatient Encounter Medications as of 12/11/2018  Medication Sig  . acetaminophen (TYLENOL) 325 MG tablet Take 2 tablets (650 mg  total) by mouth every 6 (six) hours as needed for mild pain (or Fever >/= 101).  Marland Kitchen. alendronate (FOSAMAX) 70 MG tablet Take 70 mg by mouth once a week. Take with a full glass of water on an empty stomach.  Marland Kitchen. amLODipine (NORVASC) 5 MG tablet Take 1 tablet (5 mg total) by mouth daily.  Marland Kitchen. aspirin EC 81 MG tablet Take 81 mg by mouth daily.  . cholecalciferol (VITAMIN D) 25 MCG (1000 UT) tablet Take 1,000 Units by mouth daily.  .  diclofenac sodium (VOLTAREN) 1 % GEL Apply 2 g topically 4 (four) times daily.  Marland Kitchen. docusate sodium (COLACE) 100 MG capsule Take 1 capsule (100 mg total) by mouth 2 (two) times daily.  . fluticasone (VERAMYST) 27.5 MCG/SPRAY nasal spray Place 2 sprays into the nose daily.  Marland Kitchen. guaiFENesin-dextromethorphan (ROBITUSSIN DM) 100-10 MG/5ML syrup Take 5 mLs by mouth every 4 (four) hours as needed for cough.  . latanoprost (XALATAN) 0.005 % ophthalmic solution Place 1 drop into both eyes Nightly.  . levothyroxine (SYNTHROID, LEVOTHROID) 75 MCG tablet Take 75 mcg by mouth daily before breakfast.  . lisinopril (PRINIVIL,ZESTRIL) 20 MG tablet Take 40 mg by mouth daily.  . methocarbamol (ROBAXIN) 500 MG tablet Take 1 tablet (500 mg total) by mouth every 8 (eight) hours as needed for muscle spasms.  . metoprolol succinate (TOPROL-XL) 100 MG 24 hr tablet Take 100 mg by mouth daily.  . nicotine (NICODERM CQ - DOSED IN MG/24 HOURS) 21 mg/24hr patch Place 1 patch (21 mg total) onto the skin daily.  Marland Kitchen. oxyCODONE (OXY IR/ROXICODONE) 5 MG immediate release tablet Take 1 tablet (5 mg total) by mouth every 6 (six) hours as needed for moderate pain, severe pain or breakthrough pain.  . pantoprazole (PROTONIX) 20 MG tablet Take 20 mg by mouth daily.  Marland Kitchen. senna-docusate (SENOKOT-S) 8.6-50 MG tablet Take 1 tablet by mouth at bedtime as needed for mild constipation.   No facility-administered encounter medications on file as of 12/11/2018.     PHYSICAL EXAM:  Wt 121 lb, long time smoker, VSS BP 112/68  General: NAD, frail appearing, thin, usually 25% intake at breakfast and lunch,  Cardiovascular: regular rate and rhythm Pulmonary: smokes 2 packs a day has had nicotine patch but asked to stop it. Had had oxygen but now on room air with PO2 = 95% at rest. Abdomen: soft, nontender, + bowel sounds GU: denies dysuria MSK: no edema, no joint deformities, L3 fx with kyphoplasty Skin: no rashes, no wounds Neurological: Weakness ,  right flank pain that goes to knee, shooting pain  Has oxycodone prn and volteran but not on gabapentin. Able to get oob with one assist  Marijo FileKathryn M Leith Hedlund DNP, AGPCNP-BC

## 2018-12-25 ENCOUNTER — Non-Acute Institutional Stay: Payer: Medicare Other | Admitting: Primary Care

## 2018-12-25 ENCOUNTER — Other Ambulatory Visit: Payer: Self-pay

## 2018-12-25 MED ORDER — ACETAMINOPHEN 500 MG PO TABS
1000.00 | ORAL_TABLET | ORAL | Status: DC
Start: 2018-12-23 — End: 2018-12-25

## 2018-12-25 MED ORDER — ENOXAPARIN SODIUM 40 MG/0.4ML ~~LOC~~ SOLN
40.00 | SUBCUTANEOUS | Status: DC
Start: 2018-12-23 — End: 2018-12-25

## 2018-12-25 MED ORDER — OXYCODONE HCL 5 MG PO TABS
5.00 | ORAL_TABLET | ORAL | Status: DC
Start: ? — End: 2018-12-25

## 2018-12-25 MED ORDER — GENERIC EXTERNAL MEDICATION
150.00 | Status: DC
Start: 2018-12-24 — End: 2018-12-25

## 2018-12-25 MED ORDER — FLUTICASONE PROPIONATE 50 MCG/ACT NA SUSP
1.00 | NASAL | Status: DC
Start: 2018-12-24 — End: 2018-12-25

## 2018-12-25 MED ORDER — GENERIC EXTERNAL MEDICATION
1.00 | Status: DC
Start: 2018-12-23 — End: 2018-12-25

## 2018-12-25 MED ORDER — LEVOTHYROXINE SODIUM 75 MCG PO TABS
75.00 | ORAL_TABLET | ORAL | Status: DC
Start: 2018-12-24 — End: 2018-12-25

## 2018-12-25 MED ORDER — CHOLECALCIFEROL 25 MCG (1000 UT) PO TABS
1000.00 | ORAL_TABLET | ORAL | Status: DC
Start: 2018-12-24 — End: 2018-12-25

## 2018-12-25 MED ORDER — POLYETHYLENE GLYCOL 3350 17 G PO PACK
17.00 | PACK | ORAL | Status: DC
Start: 2018-12-24 — End: 2018-12-25

## 2018-12-25 MED ORDER — PANTOPRAZOLE SODIUM 20 MG PO TBEC
20.00 | DELAYED_RELEASE_TABLET | ORAL | Status: DC
Start: 2018-12-24 — End: 2018-12-25

## 2018-12-25 MED ORDER — GENERIC EXTERNAL MEDICATION
2.00 | Status: DC
Start: 2018-12-23 — End: 2018-12-25

## 2018-12-25 MED ORDER — GABAPENTIN 100 MG PO CAPS
100.00 | ORAL_CAPSULE | ORAL | Status: DC
Start: 2018-12-23 — End: 2018-12-25

## 2018-12-25 MED ORDER — ASPIRIN 81 MG PO CHEW
81.00 | CHEWABLE_TABLET | ORAL | Status: DC
Start: 2018-12-24 — End: 2018-12-25

## 2018-12-25 MED ORDER — LISINOPRIL 20 MG PO TABS
40.00 | ORAL_TABLET | ORAL | Status: DC
Start: 2018-12-24 — End: 2018-12-25

## 2018-12-25 MED ORDER — LIDOCAINE 5 % EX PTCH
1.00 | MEDICATED_PATCH | CUTANEOUS | Status: DC
Start: 2018-12-24 — End: 2018-12-25

## 2018-12-25 MED ORDER — DICLOFENAC SODIUM 1 % TD GEL
2.00 | TRANSDERMAL | Status: DC
Start: 2018-12-23 — End: 2018-12-25

## 2019-01-01 ENCOUNTER — Other Ambulatory Visit: Payer: Self-pay

## 2019-01-01 ENCOUNTER — Encounter
Admission: RE | Admit: 2019-01-01 | Discharge: 2019-01-01 | Disposition: A | Discharge status: Home / Self Care | Payer: Medicare Other | Source: Ambulatory Visit | Attending: Orthopedic Surgery | Admitting: Orthopedic Surgery

## 2019-01-01 NOTE — Pre-Procedure Instructions (Signed)
Instruction provided to [facility] staff. Understanding verbalized.

## 2019-01-02 ENCOUNTER — Ambulatory Visit: Payer: Medicare Other

## 2019-01-02 ENCOUNTER — Other Ambulatory Visit: Payer: Self-pay

## 2019-01-02 ENCOUNTER — Ambulatory Visit
Admission: RE | Admit: 2019-01-02 | Discharge: 2019-01-02 | Disposition: A | Discharge status: Home / Self Care | Payer: Medicare Other | Attending: Orthopedic Surgery | Admitting: Orthopedic Surgery

## 2019-01-02 ENCOUNTER — Ambulatory Visit: Payer: Medicare Other | Admitting: Anesthesiology

## 2019-01-02 ENCOUNTER — Encounter
Admission: RE | Admit: 2019-01-02 | Discharge: 2019-01-02 | Disposition: A | Discharge status: Home / Self Care | Payer: Medicare Other | Source: Ambulatory Visit | Attending: Orthopedic Surgery | Admitting: Orthopedic Surgery

## 2019-01-02 ENCOUNTER — Encounter
Admission: RE | Disposition: A | Discharge status: Home / Self Care | Payer: Self-pay | Source: Home / Self Care | Attending: Orthopedic Surgery

## 2019-01-02 ENCOUNTER — Encounter: Payer: Self-pay | Admitting: *Deleted

## 2019-01-02 DIAGNOSIS — Z7983 Long term (current) use of bisphosphonates: Secondary | ICD-10-CM | POA: Insufficient documentation

## 2019-01-02 DIAGNOSIS — Z79890 Hormone replacement therapy (postmenopausal): Secondary | ICD-10-CM | POA: Diagnosis not present

## 2019-01-02 DIAGNOSIS — E079 Disorder of thyroid, unspecified: Secondary | ICD-10-CM | POA: Diagnosis not present

## 2019-01-02 DIAGNOSIS — M4854XA Collapsed vertebra, not elsewhere classified, thoracic region, initial encounter for fracture: Secondary | ICD-10-CM | POA: Insufficient documentation

## 2019-01-02 DIAGNOSIS — I10 Essential (primary) hypertension: Secondary | ICD-10-CM | POA: Insufficient documentation

## 2019-01-02 DIAGNOSIS — Z79899 Other long term (current) drug therapy: Secondary | ICD-10-CM | POA: Diagnosis not present

## 2019-01-02 DIAGNOSIS — Z7982 Long term (current) use of aspirin: Secondary | ICD-10-CM | POA: Insufficient documentation

## 2019-01-02 DIAGNOSIS — F17200 Nicotine dependence, unspecified, uncomplicated: Secondary | ICD-10-CM | POA: Diagnosis not present

## 2019-01-02 DIAGNOSIS — Z419 Encounter for procedure for purposes other than remedying health state, unspecified: Secondary | ICD-10-CM

## 2019-01-02 DIAGNOSIS — Z1159 Encounter for screening for other viral diseases: Secondary | ICD-10-CM | POA: Diagnosis not present

## 2019-01-02 HISTORY — PX: KYPHOPLASTY: SHX5884

## 2019-01-02 LAB — SURGICAL PCR SCREEN
MRSA, PCR: NEGATIVE
Staphylococcus aureus: NEGATIVE

## 2019-01-02 LAB — POCT I-STAT 4, (NA,K, GLUC, HGB,HCT)
Glucose, Bld: 77 mg/dL (ref 70–99)
HCT: 44 % (ref 36.0–46.0)
Hemoglobin: 15 g/dL (ref 12.0–15.0)
Potassium: 3.8 mmol/L (ref 3.5–5.1)
Sodium: 141 mmol/L (ref 135–145)

## 2019-01-02 LAB — SARS CORONAVIRUS 2 BY RT PCR (HOSPITAL ORDER, PERFORMED IN ~~LOC~~ HOSPITAL LAB): SARS Coronavirus 2: NEGATIVE

## 2019-01-02 SURGERY — KYPHOPLASTY
Anesthesia: Monitor Anesthesia Care | Site: Thoracic

## 2019-01-02 MED ORDER — BUPIVACAINE-EPINEPHRINE (PF) 0.5% -1:200000 IJ SOLN
INTRAMUSCULAR | Status: DC | PRN
  Administered 2019-01-02: 10 mL

## 2019-01-02 MED ORDER — METOCLOPRAMIDE HCL 5 MG/ML IJ SOLN: 5.0000 mg | Freq: Three times a day (TID) | INTRAMUSCULAR | Status: DC | PRN

## 2019-01-02 MED ORDER — ONDANSETRON HCL 4 MG/2ML IJ SOLN: 4.0000 mg | Freq: Four times a day (QID) | INTRAMUSCULAR | Status: DC | PRN

## 2019-01-02 MED ORDER — ACETAMINOPHEN 325 MG PO TABS: 325.0000 mg | ORAL_TABLET | Freq: Four times a day (QID) | ORAL | Status: DC | PRN

## 2019-01-02 MED ORDER — MIDAZOLAM HCL 2 MG/2ML IJ SOLN
INTRAMUSCULAR | Status: DC | PRN
  Administered 2019-01-02: 2 mg via INTRAVENOUS

## 2019-01-02 MED ORDER — TRAMADOL HCL 50 MG PO TABS: 50.0000 mg | ORAL_TABLET | Freq: Four times a day (QID) | ORAL | Status: DC

## 2019-01-02 MED ORDER — HYDROCODONE-ACETAMINOPHEN 5-325 MG PO TABS: 1.0000 | ORAL_TABLET | ORAL | Status: DC | PRN

## 2019-01-02 MED ORDER — LACTATED RINGERS IV SOLN
INTRAVENOUS | Status: DC
  Administered 2019-01-02: 11:00:00 via INTRAVENOUS

## 2019-01-02 MED ORDER — FENTANYL CITRATE (PF) 100 MCG/2ML IJ SOLN
INTRAMUSCULAR | Status: AC
  Administered 2019-01-02: 25 ug via INTRAVENOUS
  Filled 2019-01-02: qty 2

## 2019-01-02 MED ORDER — KETAMINE HCL 50 MG/ML IJ SOLN
INTRAMUSCULAR | Status: AC
  Filled 2019-01-02: qty 10

## 2019-01-02 MED ORDER — BUPIVACAINE-EPINEPHRINE (PF) 0.5% -1:200000 IJ SOLN
INTRAMUSCULAR | Status: AC
  Filled 2019-01-02: qty 30

## 2019-01-02 MED ORDER — PROPOFOL 500 MG/50ML IV EMUL
INTRAVENOUS | Status: DC | PRN
  Administered 2019-01-02: 25 ug/kg/min via INTRAVENOUS

## 2019-01-02 MED ORDER — METOCLOPRAMIDE HCL 10 MG PO TABS: 5.0000 mg | ORAL_TABLET | Freq: Three times a day (TID) | ORAL | Status: DC | PRN

## 2019-01-02 MED ORDER — OXYCODONE HCL 5 MG PO TABS: 5.0000 mg | ORAL_TABLET | Freq: Once | ORAL | Status: DC | PRN

## 2019-01-02 MED ORDER — LIDOCAINE HCL 1 % IJ SOLN
INTRAMUSCULAR | Status: DC | PRN
  Administered 2019-01-02: 10 mL
  Administered 2019-01-02: 5 mL

## 2019-01-02 MED ORDER — OXYCODONE HCL 5 MG/5ML PO SOLN: 5.0000 mg | Freq: Once | ORAL | Status: DC | PRN

## 2019-01-02 MED ORDER — SODIUM CHLORIDE 0.9 % IV SOLN: INTRAVENOUS | Status: DC

## 2019-01-02 MED ORDER — IOPAMIDOL (ISOVUE-M 200) INJECTION 41%
INTRAMUSCULAR | Status: DC | PRN
  Administered 2019-01-02: 15:00:00 15 mL

## 2019-01-02 MED ORDER — PROPOFOL 10 MG/ML IV BOLUS
INTRAVENOUS | Status: DC | PRN
  Administered 2019-01-02: 30 ug via INTRAVENOUS

## 2019-01-02 MED ORDER — MIDAZOLAM HCL 2 MG/2ML IJ SOLN
INTRAMUSCULAR | Status: AC
  Filled 2019-01-02: qty 2

## 2019-01-02 MED ORDER — CEFAZOLIN SODIUM-DEXTROSE 1-4 GM/50ML-% IV SOLN
1.0000 g | Freq: Once | INTRAVENOUS | Status: AC
  Administered 2019-01-02: 14:00:00 1 g via INTRAVENOUS

## 2019-01-02 MED ORDER — CEFAZOLIN SODIUM-DEXTROSE 1-4 GM/50ML-% IV SOLN
INTRAVENOUS | Status: AC
  Filled 2019-01-02: qty 50

## 2019-01-02 MED ORDER — FENTANYL CITRATE (PF) 100 MCG/2ML IJ SOLN
25.0000 ug | INTRAMUSCULAR | Status: DC | PRN
  Administered 2019-01-02: 15:00:00 25 ug via INTRAVENOUS

## 2019-01-02 MED ORDER — ONDANSETRON HCL 4 MG PO TABS: 4.0000 mg | ORAL_TABLET | Freq: Four times a day (QID) | ORAL | Status: DC | PRN

## 2019-01-02 MED ORDER — KETAMINE HCL 10 MG/ML IJ SOLN
INTRAMUSCULAR | Status: DC | PRN
  Administered 2019-01-02: 25 mg via INTRAVENOUS
  Administered 2019-01-02: 5 mg via INTRAVENOUS

## 2019-01-02 MED ORDER — MORPHINE SULFATE (PF) 4 MG/ML IV SOLN: 0.5000 mg | INTRAVENOUS | Status: DC | PRN

## 2019-01-02 MED ORDER — LIDOCAINE HCL (PF) 1 % IJ SOLN
INTRAMUSCULAR | Status: AC
  Filled 2019-01-02: qty 60

## 2019-01-02 MED ORDER — HYDROCODONE-ACETAMINOPHEN 7.5-325 MG PO TABS: 1.0000 | ORAL_TABLET | ORAL | Status: DC | PRN

## 2019-01-02 SURGICAL SUPPLY — 20 items
CEMENT KYPHON CX01A KIT/MIXER (Cement) ×3 IMPLANT
COVER WAND RF STERILE (DRAPES) ×3 IMPLANT
DERMABOND ADVANCED (GAUZE/BANDAGES/DRESSINGS) ×2
DERMABOND ADVANCED .7 DNX12 (GAUZE/BANDAGES/DRESSINGS) ×1 IMPLANT
DEVICE BIOPSY BONE KYPHX (INSTRUMENTS) ×3 IMPLANT
DRAPE C-ARM XRAY 36X54 (DRAPES) ×3 IMPLANT
DURAPREP 26ML APPLICATOR (WOUND CARE) ×3 IMPLANT
GLOVE SURG SYN 9.0  PF PI (GLOVE) ×2
GLOVE SURG SYN 9.0 PF PI (GLOVE) ×1 IMPLANT
GOWN SRG 2XL LVL 4 RGLN SLV (GOWNS) ×1 IMPLANT
GOWN STRL NON-REIN 2XL LVL4 (GOWNS) ×2
GOWN STRL REUS W/ TWL LRG LVL3 (GOWN DISPOSABLE) ×1 IMPLANT
GOWN STRL REUS W/TWL LRG LVL3 (GOWN DISPOSABLE) ×2
PACK KYPHOPLASTY (MISCELLANEOUS) ×3 IMPLANT
RENTAL RFA  GENERATOR (MISCELLANEOUS)
RENTAL RFA GENERATOR (MISCELLANEOUS) IMPLANT
STRAP SAFETY 5IN WIDE (MISCELLANEOUS) ×3 IMPLANT
TRAY KYPHOPAK 15/2 EXPRESS (KITS) ×3 IMPLANT
TRAY KYPHOPAK 15/3 EXPRESS 1ST (MISCELLANEOUS) IMPLANT
TRAY KYPHOPAK 20/3 EXPRESS 1ST (MISCELLANEOUS) IMPLANT

## 2019-01-02 NOTE — H&P (Signed)
Subjective:   Patient is a [age] y.o. female presents with history of prior compression fractures who recently was [facility] where she had a MRI showing a new compression fracture at T11 which had a prior compression fracture.. Onset of symptoms was a month ago     ago with gradually worsening course since that time. The pain is located in the mid back radiating to the right hip. Patient describes the pain as aching continuous and rated as moderate. Pain has been associated with no injury she recalls. Patient denies any recent injury. Symptoms are aggravated by standing and walking. Symptoms improve with laying flat. Past history includes kyphoplasty's in the past at T10 and L3.  Previous studies include [facility] showing superior endplate edema at [id number].  Patient Active Problem List   Diagnosis Date Noted  . Vertebral compression fracture (HCC) 11/23/2018  . Compression fracture of body of thoracic vertebra (HCC) 07/07/2018   Past Medical History:  Diagnosis Date  . Brain aneurysm   . Hypertension   . Thyroid disease     Past Surgical History:  Procedure Laterality Date  . BRAIN SURGERY    . KYPHOPLASTY N/A 07/09/2018   Procedure: [id number]-T10;  Surgeon: Kennedy BuckerMenz, Christmas Faraci, MD;  Location: [facility] ORS;  Service: Orthopedics;  Laterality: N/A;  . KYPHOPLASTY N/A 11/25/2018   Procedure: KYPHOPLASTY L3;  Surgeon: Kennedy BuckerMenz, Cephus Tupy, MD;  Location: [facility] ORS;  Service: Orthopedics;  Laterality: N/A;  . spleen      Medications Prior to Admission  Medication Sig Dispense Refill Last Dose  . acetaminophen (TYLENOL) 325 MG tablet Take 2 tablets (650 mg total) by mouth every 6 (six) hours as needed for mild pain (or Fever >/= 101). (Patient taking differently: Take 650 mg by mouth every 6 (six) hours as needed for mild pain (or Fever >/= 101). 01-06-09)     . alendronate (FOSAMAX) 70 MG tablet Take 70 mg by mouth once a week. Take with a full glass of water on an empty stomach.   Past Week at Unknown time  .  aspirin EC 81 MG tablet Take 81 mg by mouth daily.   11/23/2018 at 0800  . calcium carbonate (OS-CAL) 600 MG TABS tablet Take 600 mg by mouth 2 (two) times daily with a meal. 9a-5p     . cholecalciferol (VITAMIN D) 25 MCG (1000 UT) tablet Take 1,000 Units by mouth daily.   11/22/2018 at 0800  . diclofenac sodium (VOLTAREN) 1 % GEL Apply 2 g topically 4 (four) times daily.   prn at prn  . fluticasone (VERAMYST) 27.5 MCG/SPRAY nasal spray Place 2 sprays into the nose daily.   11/23/2018 at 0900  . gabapentin (NEURONTIN) 100 MG capsule Take 100 mg by mouth 3 (three) times daily.     Marland Kitchen. latanoprost (XALATAN) 0.005 % ophthalmic solution Place 1 drop into both eyes Nightly.     . levothyroxine (SYNTHROID, LEVOTHROID) 75 MCG tablet Take 75 mcg by mouth daily before breakfast.   11/23/2018 at 0730  . lidocaine (LIDODERM) 5 % Place 1 patch onto the skin daily. Remove & Discard patch within 12 hours or as directed by MD     . lisinopril (PRINIVIL,ZESTRIL) 20 MG tablet Take 40 mg by mouth daily.   11/23/2018 at 0800  . metoprolol succinate (TOPROL-XL) 100 MG 24 hr tablet Take 150 mg by mouth daily.    11/23/2018 at 0800  . oxyCODONE (OXY IR/ROXICODONE) 5 MG immediate release tablet Take 1 tablet (5  mg total) by mouth every 6 (six) hours as needed for moderate pain, severe pain or breakthrough pain. 20 tablet 0   . pantoprazole (PROTONIX) 20 MG tablet Take 20 mg by mouth daily.   11/22/2018 at 0800  . polyethylene glycol (MIRALAX / GLYCOLAX) 17 g packet Take 17 g by mouth daily.     Marland Kitchen senna-docusate (SENOKOT-S) 8.6-50 MG tablet Take 1 tablet by mouth at bedtime as needed for mild constipation. (Patient taking differently: Take 2 tablets by mouth at bedtime. )      Allergies  Allergen Reactions  . Ibuprofen   . Naproxen     Social History   Tobacco Use  . Smoking status: Current Every Day Smoker  . Smokeless tobacco: Never Used  Substance Use Topics  . Alcohol use: Not Currently    No family history on file.   Review of Systems Pertinent items are noted in HPI.  Objective:   No data found. No intake/output data recorded. No intake/output data recorded.    There were no vitals taken for this visit. General appearance: alert and appears stated age Lungs: clear to auscultation bilaterally Heart: regular rate and rhythm, S1, S2 normal, no murmur, click, rub or gallop Minimal tenderness to percussion in the mid back where she has a kyphotic deformity  Data ReviewRadiology review: New T11 compression fracture where she had forever prior mild T11 compression fracture  Assessment:   Active Problems:   * No active hospital problems. * Subacute T11 compression fracture with persistent pain rating to right hip  Plan:   T11 kyphoplasty hoping to improve her pain levels that she can become more active

## 2019-01-02 NOTE — Anesthesia Post-op Follow-up Note (Signed)
Anesthesia QCDR form completed.        

## 2019-01-02 NOTE — Anesthesia Preprocedure Evaluation (Signed)
Anesthesia Evaluation  Patient identified by MRN, date of birth, ID band Patient awake    Reviewed: Allergy & Precautions, H&P , NPO status , Patient's Chart, lab work & pertinent test results  History of Anesthesia Complications Negative for: history of anesthetic complications  Airway Mallampati: III  TM Distance: >3 FB Neck ROM: full    Dental  (+) Missing, Poor Dentition, Upper Dentures, Lower Dentures   Pulmonary neg shortness of breath, former smoker,           Cardiovascular Exercise Tolerance: Good hypertension, (-) angina(-) Past MI and (-) DOE      Neuro/Psych negative neurological ROS  negative psych ROS   GI/Hepatic negative GI ROS, Neg liver ROS,   Endo/Other  negative endocrine ROS  Renal/GU negative Renal ROS  negative genitourinary   Musculoskeletal   Abdominal   Peds  Hematology negative hematology ROS (+)   Anesthesia Other Findings Past Medical History: No date: Brain aneurysm No date: Hypertension No date: Thyroid disease  Past Surgical History: No date: BRAIN SURGERY 07/09/2018: KYPHOPLASTY; N/A     Comment:  Procedure: KYPHOPLASTY-T10;  Surgeon: Kennedy Bucker, MD;              Location: [facility] ORS;  Service: Orthopedics;  Laterality:               N/A; 11/25/2018: KYPHOPLASTY; N/A     Comment:  Procedure: KYPHOPLASTY L3;  Surgeon: Kennedy Bucker, MD;               Location: [facility] ORS;  Service: Orthopedics;  Laterality:               N/A; No date: spleen  BMI    Body Mass Index:  20.23 kg/m      Reproductive/Obstetrics negative OB ROS                             Anesthesia Physical Anesthesia Plan  ASA: III  Anesthesia Plan: General   Post-op Pain Management:    Induction: Intravenous  PONV Risk Score and Plan: Propofol infusion and TIVA  Airway Management Planned: Natural Airway and Nasal Cannula  Additional Equipment:   Intra-op Plan:    Post-operative Plan:   Informed Consent: I have reviewed the patients History and Physical, chart, labs and discussed the procedure including the risks, benefits and alternatives for the proposed anesthesia with the patient or authorized representative who has indicated his/her understanding and acceptance.     Dental Advisory Given  Plan Discussed with: Anesthesiologist, CRNA and Surgeon  Anesthesia Plan Comments: (Patient consented for risks of anesthesia including but not limited to:  - adverse reactions to medications - risk of intubation if required - damage to teeth, lips or other oral mucosa - sore throat or hoarseness - Damage to heart, brain, lungs or loss of life  Patient voiced understanding.)        Anesthesia Quick Evaluation

## 2019-01-02 NOTE — Op Note (Signed)
Date 01/02/2019  Time  2:45 pm   PATIENT:  Emma Mejia   PRE-OPERATIVE DIAGNOSIS:  closed wedge compression fracture of T11   POST-OPERATIVE DIAGNOSIS:  closed wedge compression fracture of T11   PROCEDURE:  Procedure(s): KYPHOPLASTY T11  SURGEON: Laurene Footman, MD   ASSISTANTS: None   ANESTHESIA:   local and MAC   EBL:  No intake/output data recorded.   BLOOD ADMINISTERED:none   DRAINS: none    LOCAL MEDICATIONS USED:  MARCAINE    and XYLOCAINE    SPECIMEN:   T11 vertebral body biopsy   DISPOSITION OF SPECIMEN:  Pathology   COUNTS:  YES   TOURNIQUET:  * No tourniquets in log *   IMPLANTS: Bone cement   DICTATION: .Dragon Dictation  patient was brought to the operating room and after adequate anesthesia was obtained the patient was placed prone.  C arm was brought in in good visualization of the affected level obtained on both AP and lateral projections.  After patient identification and timeout procedures were completed, local anesthetic was infiltrated with 5 cc 1% Xylocaine infiltrated subcutaneously.  This is done the area on the left side of the planned approach secondary to scoliosis which gave the left pedicle more of a convex side and a larger pedicle.  The back was then prepped and draped in the usual sterile manner and repeat timeout procedure carried out.  A spinal needle was brought down to the pedicle on the left  side of  T11 and a 50-50 mix of 1% Xylocaine half percent Sensorcaine with epinephrine total of 20 cc injected.  After allowing this to set a small incision was made and the trocar was advanced into the vertebral body in an extrapedicular fashion.  Biopsy was obtained Drilling was carried out balloon inserted with inflation to  to cc.  When the cement was appropriate consistency 3-1/2 cc were injected into the vertebral body without extravasation, good fill superior to inferior endplates and from right to left sides along the inferior endplate.  After  the cement had set the trochar was removed and permanent C-arm views obtained.  The wound was closed with Dermabond followed by Band-Aid   PLAN OF CARE: Discharge to home after PACU   PATIENT DISPOSITION:  PACU - hemodynamically stable.

## 2019-01-02 NOTE — Discharge Instructions (Addendum)
Remove Band-Aid on Saturday then okay to shower.  Take it easy with activities today and tomorrow and then try to get more active with walking and therapy on Saturday.  Call office if you are having problems.   AMBULATORY SURGERY  DISCHARGE INSTRUCTIONS   1) The drugs that you were given will stay in your system until tomorrow so for the next 24 hours you should not:  A) Drive an automobile B) Make any legal decisions C) Drink any alcoholic beverage   2) You may resume regular meals tomorrow.  Today it is better to start with liquids and gradually work up to solid foods.  You may eat anything you prefer, but it is better to start with liquids, then soup and crackers, and gradually work up to solid foods.   3) Please notify your doctor immediately if you have any unusual bleeding, trouble breathing, redness and pain at the surgery site, drainage, fever, or pain not relieved by medication.    4) Additional Instructions:        Please contact your physician with any problems or Same Day Surgery at [phone], Monday through Friday 6 am to 4 pm, or [place] at [facility] number at [phone].

## 2019-01-02 NOTE — Transfer of Care (Signed)
Immediate Anesthesia Transfer of Care Note  Patient: Furniture conservator/restorer  Procedure(s) Performed: KYPHOPLASTY T11 (N/A Thoracic)  Patient Location: PACU  Anesthesia Type:MAC  Level of Consciousness: awake and alert   Airway & Oxygen Therapy: Patient Spontanous Breathing  Post-op Assessment: Report given to RN and Post -op Vital signs reviewed and stable  Post vital signs: Reviewed and stable  Last Vitals:  Vitals Value Taken Time  BP 172/79 01/02/2019  2:49 PM  Temp 36.7 C 01/02/2019  2:49 PM  Pulse 89 01/02/2019  2:51 PM  Resp 18 01/02/2019  2:51 PM  SpO2 95 % 01/02/2019  2:51 PM  Vitals shown include unvalidated device data.  Last Pain:  Vitals:   01/02/19 1449  TempSrc:   PainSc: 0-No pain         Complications: No apparent anesthesia complications

## 2019-01-03 ENCOUNTER — Encounter: Payer: Self-pay | Admitting: Orthopedic Surgery

## 2019-01-05 NOTE — Anesthesia Postprocedure Evaluation (Signed)
Anesthesia Post Note  Patient: Furniture conservator/restorer  Procedure(s) Performed: KYPHOPLASTY T11 (N/A Thoracic)  Patient location during evaluation: PACU Anesthesia Type: MAC Level of consciousness: awake and alert Pain management: pain level controlled Vital Signs Assessment: post-procedure vital signs reviewed and stable Respiratory status: spontaneous breathing, nonlabored ventilation, respiratory function stable and patient connected to nasal cannula oxygen Cardiovascular status: blood pressure returned to baseline and stable Postop Assessment: no apparent nausea or vomiting Anesthetic complications: no     Last Vitals:  Vitals:   01/02/19 1534 01/02/19 1547  BP: (!) 162/76 (!) 171/79  Pulse: 77 83  Resp: 16 16  Temp: 36.6 C 36.8 C  SpO2: 94% 93%    Last Pain:  Vitals:   01/02/19 1547  TempSrc: Temporal  PainSc: 5                  Precious Haws Skyanne Welle

## 2019-01-06 LAB — SURGICAL PATHOLOGY

## 2019-01-24 ENCOUNTER — Telehealth: Payer: Self-pay | Admitting: Nurse Practitioner

## 2019-01-24 NOTE — Telephone Encounter (Signed)
Patient was recently discharged from [facility] and was receiving Palliative services there and Dr. Clent Jacks was in agreement to continue Palliative services at home.  Spoke with [name] (daugh) and have scheduled a Telehealth Palliative f/u visit for 02/10/19 @ 11 AM.

## 2019-02-02 IMAGING — CT CT L SPINE W/O CM
3 series · 12 of 35 positions shown, 14 images · non-contrast
Comparison: None.

CLINICAL DATA: Back pain after a fall today.  Initial encounter.

EXAM:
CT LUMBAR SPINE WITHOUT CONTRAST
TECHNIQUE: Multidetector CT imaging of the lumbar spine was performed without
intravenous contrast administration. Multiplanar CT image
reconstructions were also generated.

[Series 4: l spine soft · axial · 0.36mm/px · z∈[-430,-260]mm · 4 of 123 slices shown, 5 images]
[im 19/123  soft-tissue]
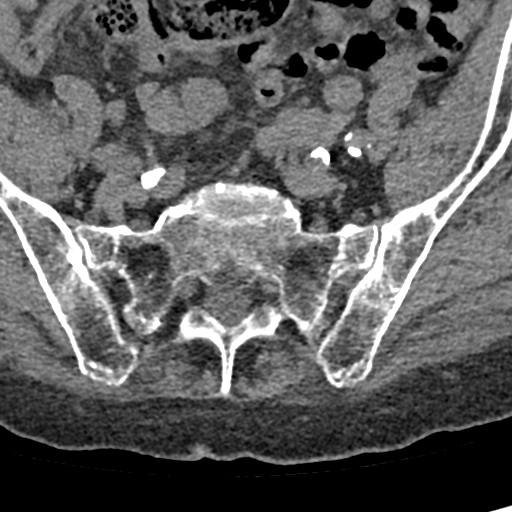
[im 19/123  bone]
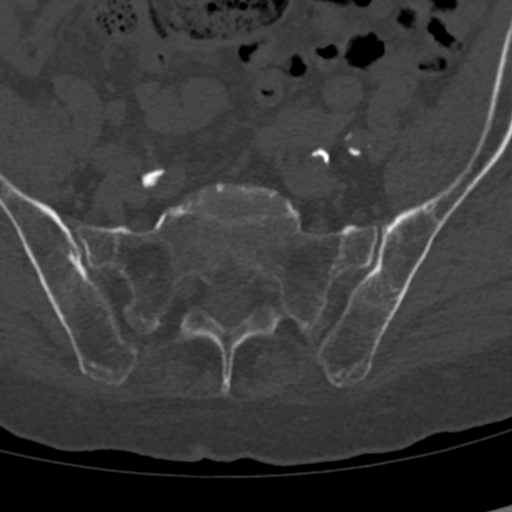
[im 47/123  bone]
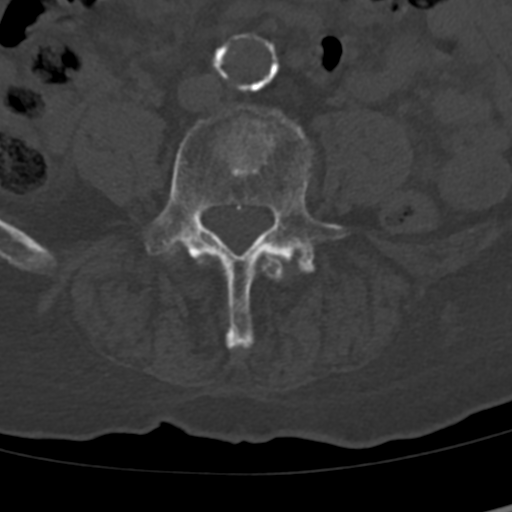
[im 76/123  bone]
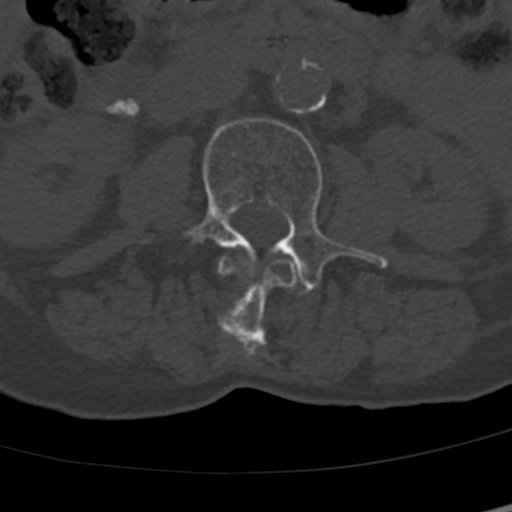
[im 104/123  bone]
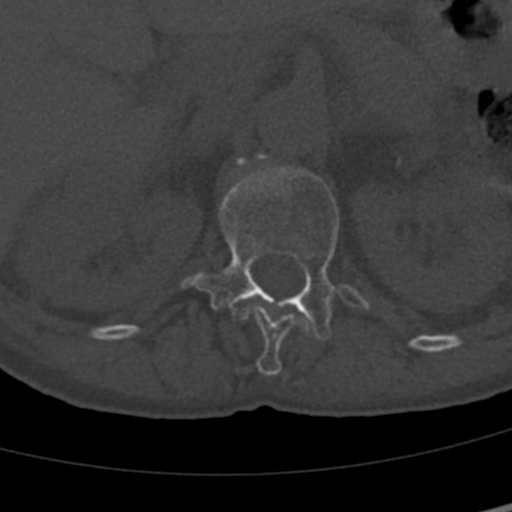

[Series 5: sagittal bone · sagittal · 0.36mm/px · 5 of 92 slices shown, 6 images]
[im 31/92  bone]
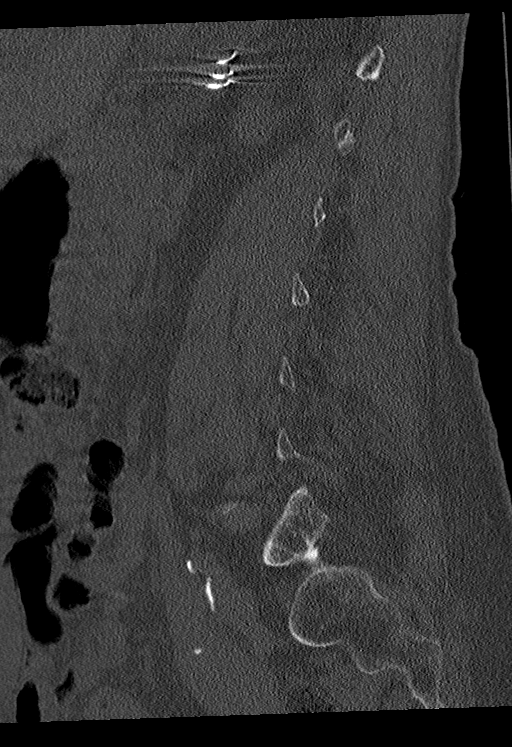
[im 38/92  bone]
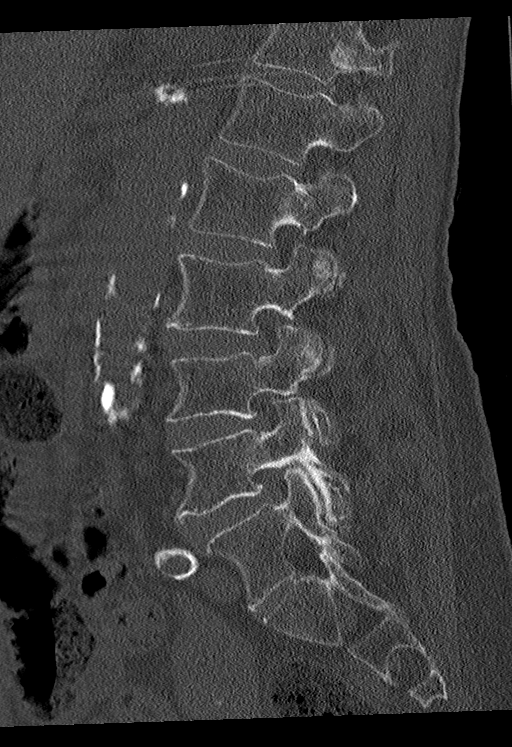
[im 46/92  soft-tissue]
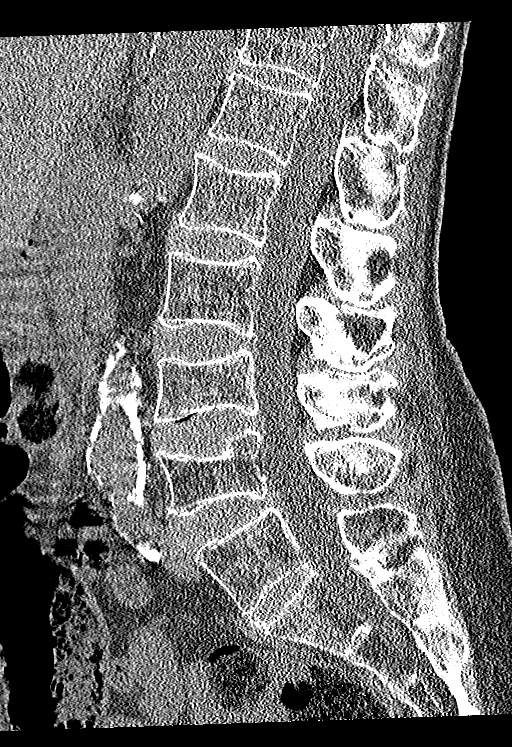
[im 46/92  bone]
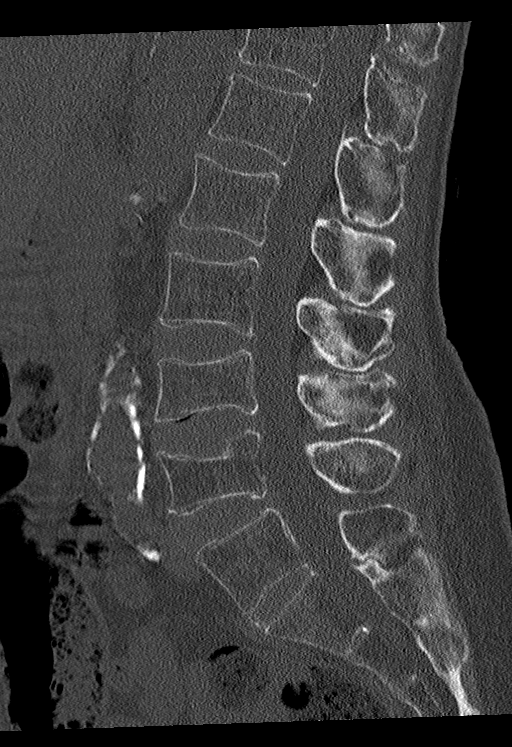
[im 54/92  bone]
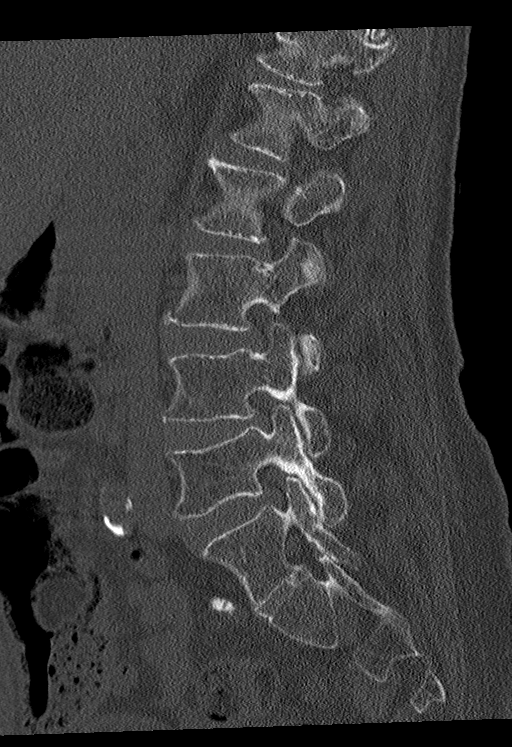
[im 61/92  bone]
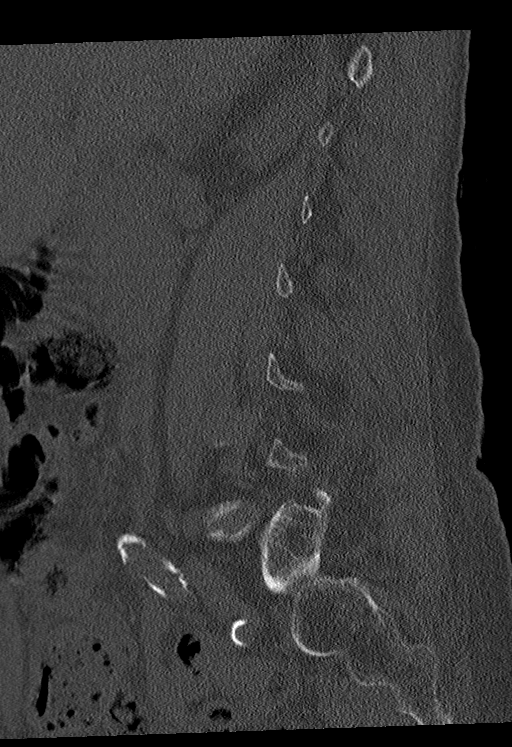

[Series 6: coronal bone · coronal · 0.34mm/px · 3 of 85 slices shown]
[im 17/85  bone]
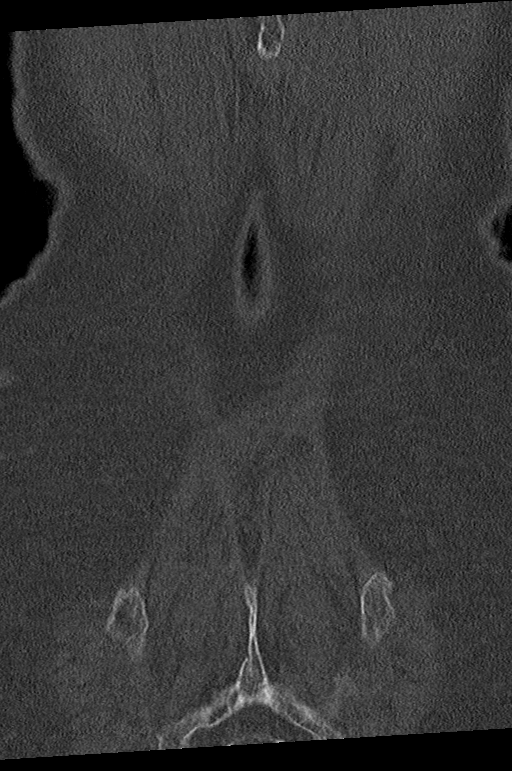
[im 34/85  bone]
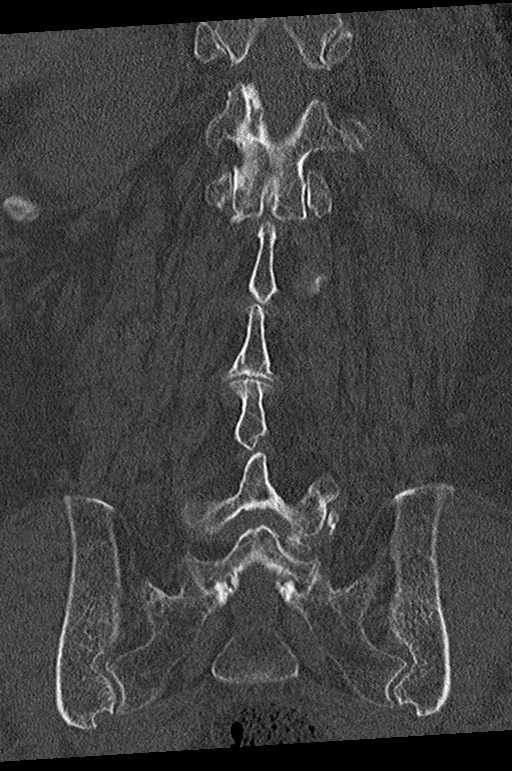
[im 51/85  bone]
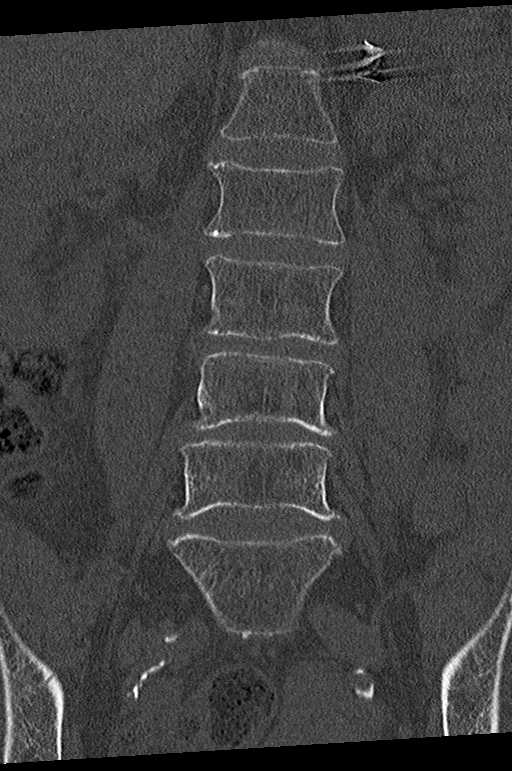

[12 of 35 positions shown; findings below may reference images not displayed]

FINDINGS: Segmentation: Transitional lumbosacral anatomy with partial
lumbarization of S1.

Alignment: Normal.

Vertebrae: L4 and L5 compression fractures with mild vertebral body
height loss, no retropulsion, no discretely visible fracture line,
and no significant paravertebral edema. No suspicious osseous
lesion.

Paraspinal and other soft tissues: Extensive aortic atherosclerosis
without aneurysm. Gallstones. Status post splenectomy.

Disc levels: Preserved disc space heights throughout the lumbar
spine. Disc bulging results in mild to moderate multilevel neural
foraminal stenosis, greatest at L5-S1. No evidence of significant
spinal stenosis. Mild multilevel facet hypertrophy.
IMPRESSION: 1. Mild L4 and L5 compression fractures, technically age
indeterminate though favored to be chronic. Consider lumbar spine
MRI if there is clinical concern for an acute fracture.
2. Mild lumbar spondylosis with mild-to-moderate neural foraminal
stenosis. No spinal stenosis.
3.  Aortic Atherosclerosis ([id number]).

## 2019-02-10 ENCOUNTER — Other Ambulatory Visit: Payer: Medicare Other | Admitting: Nurse Practitioner

## 2019-02-10 ENCOUNTER — Other Ambulatory Visit: Payer: Self-pay

## 2019-02-17 ENCOUNTER — Telehealth: Payer: Self-pay | Admitting: Nurse Practitioner

## 2019-02-17 NOTE — Telephone Encounter (Signed)
Rec'd call from patient's daughter [name] to reschedule the initial Consult that was scheduled for 02/10/19.  This was rescheduled for 02/18/19 @ 3:30 PM.

## 2019-02-18 ENCOUNTER — Other Ambulatory Visit: Payer: Self-pay

## 2019-02-18 ENCOUNTER — Other Ambulatory Visit: Payer: Medicare Other | Admitting: Nurse Practitioner

## 2019-02-18 ENCOUNTER — Encounter: Payer: Self-pay | Admitting: Nurse Practitioner

## 2019-02-18 DIAGNOSIS — Z515 Encounter for palliative care: Secondary | ICD-10-CM

## 2019-02-18 DIAGNOSIS — G8929 Other chronic pain: Secondary | ICD-10-CM | POA: Insufficient documentation

## 2019-02-18 DIAGNOSIS — R531 Weakness: Secondary | ICD-10-CM | POA: Insufficient documentation

## 2019-02-18 NOTE — Progress Notes (Signed)
[facility] Consult Note Telephone: [phone]  Fax: [phone]  PATIENT NAME: Emma OpitzWilma Botting DOB: Oct 12, 1945 MRN: [id number]  PRIMARY CARE PROVIDER:   Bertram SavinFeltner, Cynthia K, MD  REFERRING PROVIDER:  Bertram SavinFeltner, Cynthia K, MD [address] Medicine [id number] [facility] [address] [place] [id number]  RESPONSIBLE PARTY:   Daughter Jilda RocheSoina Power [phone]  Due to the COVID-19 crisis, this visit was done via telemedicine from my office and it was initiated and consent by this patient and or family.  RECOMMENDATIONS and PLAN:  1.Palliative care encounter Z51.5; Palliative medicine team will continue to support patient, patient's family, and medical team. Visit consisted of counseling and education dealing with the complex and emotionally intense issues of symptom management and palliative care in the setting of serious and potentially life-threatening illness  2. Generalized weakness R53.1  secondary to continue with therapy as able. Encourage energy conservation and rest times.  3. Chronic pain G89.29 monitor on pain scale,continue with therapy for strengthening; continue with epidural scheduled, call for increase in pain  ASSESSMENT:   I called [name] and was able to zoom video with Ms Philbert RiserBellinger and Lissa HoardSonia, her daughter. We talked about purpose or palliative care visit and [name] Ms Philbert RiserBellinger in agreement. We talked about past medical history in the setting of chronic disease. We talked about symptoms of pain what she does exhibit intermittently. We talked about recent hospitalizations and compression fractures. We talked about upcoming epidural for her back pain which was recommended after kyphoplasty. She has a follow-up appointment the end of July for the injection. We talked about her work she is doing with therapy PT OT and speech. She is doing well. She is progressing with pain controlled. Appetite has been improving. 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 We talked about family dynamics including [name] her daughter and she has a son who help with decision-making. We talked about medical goals of care. [name] reviewed living will and will email to be able to download to [employer]. We talked about medical goals of care including aggressive versus conservative vs comfort care. We talked about code status says she currently is a full code. We talked about different scenarios including CPR.[name] and agreement to sending a blank most form and her choice book for review and will revisit next palliative care scheduled visit. [name] wanted to further discuss with Ms Philbert RiserBellinger and her brother prior to deciding about changing code status. We talked about role of palliative care and plan of care. Follow-up visit scheduled. Therapeutic listening and emotional support provided. Contact information provided. Questions answered satisfaction  6 / 18 / 2020 weight 125.0 lb   I spent 60 minutes providing this consultation,  from 3:30pm to 4:30pm. More than 50% of the time in this consultation was spent coordinating communication.   HISTORY OF PRESENT ILLNESS:  Emma Mejia is a 73 y.o. year old female with multiple medical problems including  Brain aneurysm s/p sgy, thyroid disease, hypertension, compression fracture of thoracic vertebrae kyphoplasty. Hospitalized 4/ 18 / 2020 to 4 / 21/2020 for compression fracture of L3 with severe back pain. She did have a kyphoplasty on 11/25/2018 and tolerated procedure well. She continued in an lSO brace. She did have low-dose Norvasc for hypertension in the setting of pain. Her documentation she has had multiple Falls over the year and [facility] to right leg pain  with Team consulted in recommended short-term rehab to where she was discharged on 5/18 / 2020. She did have procedure 5 / 28 / 2020 by Dr. Rosita KeaMenz for T-11 kyphoplasty. She said that [facility]  on 6/18 / 2020 please with kyphoplasty procedure but having burning, numbness and tingling right anterolateral thigh going down to knee. She is taking Gabapentin, oxycodone q6 hours and Tylenol. Ambulating with a cane and referred for lumbar ESI injection to the lumbar spine to see if this will help alleviate right anterior lateral thigh pain. MRI of the lumbar spine shows multifactorial moderate to severe bilateral neuroforaminal narrowing right side worse than left. She was [facility]12 / [facility] to home with PT/OT where she currently resides. Her documentation [facility] did evaluate on 6/29 / 2020 and was able to identify cognitive issues with education for daughter on dementia management strategy. Speech therapy was ordered. She lives in her daughter [name]'s home currently. Palliative Care was asked to help to continue to address goals of care.   CODE STATUS: full code  PPS: 40% HOSPICE ELIGIBILITY/DIAGNOSIS: TBD  PAST MEDICAL HISTORY:  Past Medical History:  Diagnosis Date   Brain aneurysm    Hypertension    Thyroid disease     SOCIAL HX:  Social History   Tobacco Use   Smoking status: Former Smoker   Smokeless tobacco: Never Used  Substance Use Topics   Alcohol use: Not Currently    ALLERGIES:  Allergies  Allergen Reactions   Ibuprofen    Naproxen      PERTINENT MEDICATIONS:  Outpatient Encounter Medications as of 02/18/2019  Medication Sig   acetaminophen (TYLENOL) 325 MG tablet Take 2 tablets (650 mg total) by mouth every 6 (six) hours as needed for mild pain (or Fever >/= 101). (Patient taking differently: Take 650 mg by mouth every 6 (six) hours as needed for mild pain (or Fever >/= 101). 01-06-09)   alendronate (FOSAMAX) 70 MG tablet Take 70 mg by mouth once a week. Take with a full glass of water on an empty stomach.   aspirin EC 81 MG tablet Take 81 mg by mouth daily.   calcium carbonate  (OS-CAL) 600 MG TABS tablet Take 600 mg by mouth 2 (two) times daily with a meal. 9a-5p   cholecalciferol (VITAMIN D) 25 MCG (1000 UT) tablet Take 1,000 Units by mouth daily.   diclofenac sodium (VOLTAREN) 1 % GEL Apply 2 g topically 4 (four) times daily.   fluticasone (VERAMYST) 27.5 MCG/SPRAY nasal spray Place 2 sprays into the nose daily.   gabapentin (NEURONTIN) 100 MG capsule Take 100 mg by mouth 3 (three) times daily.   latanoprost (XALATAN) 0.005 % ophthalmic solution Place 1 drop into both eyes Nightly.   levothyroxine (SYNTHROID, LEVOTHROID) 75 MCG tablet Take 75 mcg by mouth daily before breakfast.   lidocaine (LIDODERM) 5 % Place 1 patch onto the skin daily. Remove & Discard patch within 12 hours or as directed by MD   lisinopril (PRINIVIL,ZESTRIL) 20 MG tablet Take 40 mg by mouth daily.   metoprolol succinate (TOPROL-XL) 100 MG 24 hr tablet Take 150 mg by mouth daily.    oxyCODONE (OXY IR/ROXICODONE) 5 MG immediate release tablet Take 1 tablet (5 mg total) by mouth every 6 (six) hours as needed for moderate pain, severe pain or breakthrough pain.   pantoprazole (PROTONIX) 20 MG tablet Take 20 mg by mouth daily.   polyethylene glycol (MIRALAX / GLYCOLAX)  17 g packet Take 17 g by mouth daily.   senna-docusate (SENOKOT-S) 8.6-50 MG tablet Take 1 tablet by mouth at bedtime as needed for mild constipation. (Patient taking differently: Take 2 tablets by mouth at bedtime. )   No facility-administered encounter medications on file as of 02/18/2019.     PHYSICAL EXAM:   Deferred  Addaline Peplinski Z Masin Shatto, NP

## 2019-03-11 ENCOUNTER — Telehealth: Payer: Self-pay | Admitting: Nurse Practitioner

## 2019-03-11 NOTE — Telephone Encounter (Signed)
I called Emma Pique, Ms. Jaworowski daughter for scheduled palliative care telemedicine meeting. [name] requested to reschedule. Rescheduled per request. Contact information.

## 2019-03-25 ENCOUNTER — Other Ambulatory Visit: Payer: Medicare Other | Admitting: Nurse Practitioner

## 2019-03-25 ENCOUNTER — Telehealth: Payer: Self-pay | Admitting: Nurse Practitioner

## 2019-03-25 ENCOUNTER — Encounter: Payer: Self-pay | Admitting: Nurse Practitioner

## 2019-03-25 ENCOUNTER — Other Ambulatory Visit: Payer: Self-pay

## 2019-03-25 DIAGNOSIS — Z515 Encounter for palliative care: Secondary | ICD-10-CM

## 2019-03-25 NOTE — Progress Notes (Signed)
Therapist, [facility] Consult Note Telephone: [phone]  Fax: [phone]  PATIENT NAME: Emma OpitzWilma Mejia DOB: 1946-05-12 MRN: [id number]  PRIMARY CARE PROVIDER:   Bertram SavinFeltner, Emma K, MD  REFERRING PROVIDER:  Bertram SavinFeltner, Emma K, MD [address] Medicine [id number] [facility] [address] [place],  [place] [id number]  RESPONSIBLE PARTY:   Daughter Emma CloudSonya Mejia [phone]  Due to the COVID-19 crisis, this visit was done via telemedicine from my office and it was initiated and consent by this patient and or family.  RECOMMENDATIONS and PLAN:  1. ACP; completed at primary and Emma HoardSonia will email a copy to place in Epic/Vynca. MOST form per Emma HoardSonia to be completed by primary provider and when completed Emma HoardSonia will [facility]  2. Generalized weaknessR53.1  secondary to continue with therapy as able. Encourage energy conservation and rest times.  3. Chronic pain G89.29 monitor on pain scale,continue with therapy for strengthening, and exercises given when PT completed; continue voltaren gel, tylenol and oxycodone for severe pain. call for increase in pain  4. Palliative care encounter Z51.5; Palliative medicine team will continue to support patient, patient's family, and medical team. Visit consisted of counseling and education dealing with the complex and emotionally intense issues of symptom management and palliative care in the setting of serious and potentially life-threatening illness  I spent 45 minutes providing this consultation,  from 9:15am to 10:00am. More than 50% of the time in this consultation was spent coordinating communication.   HISTORY OF PRESENT ILLNESS:  Emma OpitzWilma Keetch is a 73 y.o. year old female with multiple medical problems including Brain aneurysm s/p sgy, thyroid disease, hypertension, compression fracture of thoracic vertebrae kyphoplasty. Hospitalized 4/ 18 / 2020 to 4 / 21/2020 for compression fracture of L3 with severe  back pain. She did have a kyphoplasty on 11/25/2018 and tolerated procedure well. She continued in an lSO brace. She did have low-dose Norvasc for hypertension in the setting of pain. Her documentation she has had multiple Falls over the year and [facility] due to right leg pain with Team consulted in recommended short-term rehab to where she was discharged on 5/18 / 2020. She did have procedure 5 / 28 / 2020 by Dr. Rosita KeaMenz for T-11 kyphoplasty. I called Emma HoardSonia, Emma Mejia's daughter for telemedicine visit. She had forgotten but was in agreement to reschedule at 9:15 today by telemedicine. Telemedicine visit schedule palliative care follow-up as requested. We talked about how much spelling answer is feeling today. Emma Mejia endorses video that she is doing okay. She has been working with therapy walking. No recent Falls. She has a couple more sessions with physical therapy and that will be complete. We talked about the epidural that she did receive since last palliative care visit. Emma HoardSonia endorses her concerns that she has been having more pain since the epidural in that area. She talked about having to use more Voltaren gel, Tylenol and also oxycodone as needed. Emma HoardSonia endorses typically she has to give her one oxycodone every few days. Discuss to recontact Orthopedics for further discussion of increase in pain. She has not as of yet maximized out on the oxycodone so discussed that can use as every six hours one tablet as needed for moderate-to-severe pain. We talked about Emma Mejia continue to reside with her daughter Emma HoardSonia at her home while they are remodeling this [employer]. We talked about increase in strength by working with physical therapy. We talked  about exercises that she can continue to do after therapy has been completed. We talked about their appetite which has been improving. Praised Emma. Kasa for increase in appetite. She has no concerns or complaints. No hospitalizations or  infection since last palliative care visit. We talked about medical goals of care and she did have a visit with her gerontologist with completion of Emma Mejia form. Emma HoardSonia was going to emailed copy of Emma Mejia form so can place in vinca / epic. Offered to redo a copy but Emma HoardSonia endorses she would like to continue to use the copy she has and put on file. We talked about most form for which she had a blank form. Emma HoardSonia endorses she wants to complete that form with her primary provider. Review questions. Discuss the role of palliative care and plan of care. We talked about follow-up palliative care visit in two months if needed or sooner should she declined. [name] and Emma. Lieser are both in agreement and scheduled. Questions answered to satisfaction. Therapeutic listening emotional support provided. Contact information. Palliative Care was asked to help address goals of care.   CODE STATUS: Emma Mejia  PPS: 40% HOSPICE ELIGIBILITY/DIAGNOSIS: TBD  PAST MEDICAL HISTORY:  Past Medical History:  Diagnosis Date   Brain aneurysm    Hypertension    Thyroid disease     SOCIAL HX:  Social History   Tobacco Use   Smoking status: Former Smoker   Smokeless tobacco: Never Used  Substance Use Topics   Alcohol use: Not Currently    ALLERGIES:  Allergies  Allergen Reactions   Ibuprofen    Naproxen      PERTINENT MEDICATIONS:  Outpatient Encounter Medications as of 03/25/2019  Medication Sig   acetaminophen (TYLENOL) 325 MG tablet Take 2 tablets (650 mg total) by mouth every 6 (six) hours as needed for mild pain (or Fever >/= 101). (Patient taking differently: Take 650 mg by mouth every 6 (six) hours as needed for mild pain (or Fever >/= 101). 01-06-09)   alendronate (FOSAMAX) 70 MG tablet Take 70 mg by mouth once a week. Take with a full glass of water on an empty stomach.   aspirin EC 81 MG tablet Take 81 mg by mouth daily.   calcium carbonate (OS-CAL) 600 MG TABS tablet Take 600 mg by mouth 2 (two) times  daily with a meal. 9a-5p   cholecalciferol (VITAMIN D) 25 MCG (1000 UT) tablet Take 1,000 Units by mouth daily.   diclofenac sodium (VOLTAREN) 1 % GEL Apply 2 g topically 4 (four) times daily.   fluticasone (VERAMYST) 27.5 MCG/SPRAY nasal spray Place 2 sprays into the nose daily.   gabapentin (NEURONTIN) 100 MG capsule Take 100 mg by mouth 3 (three) times daily.   latanoprost (XALATAN) 0.005 % ophthalmic solution Place 1 drop into both eyes Nightly.   levothyroxine (SYNTHROID, LEVOTHROID) 75 MCG tablet Take 75 mcg by mouth daily before breakfast.   lidocaine (LIDODERM) 5 % Place 1 patch onto the skin daily. Remove & Discard patch within 12 hours or as directed by MD   lisinopril (PRINIVIL,ZESTRIL) 20 MG tablet Take 40 mg by mouth daily.   metoprolol succinate (TOPROL-XL) 100 MG 24 hr tablet Take 150 mg by mouth daily.    oxyCODONE (OXY IR/ROXICODONE) 5 MG immediate release tablet Take 1 tablet (5 mg total) by mouth every 6 (six) hours as needed for moderate pain, severe pain or breakthrough pain.   pantoprazole (PROTONIX) 20 MG tablet Take 20 mg by mouth daily.  polyethylene glycol (MIRALAX / GLYCOLAX) 17 g packet Take 17 g by mouth daily.   senna-docusate (SENOKOT-S) 8.6-50 MG tablet Take 1 tablet by mouth at bedtime as needed for mild constipation. (Patient taking differently: Take 2 tablets by mouth at bedtime. )   No facility-administered encounter medications on file as of 03/25/2019.     PHYSICAL EXAM:   Deferred  Arilyn Brierley Z Ramiya Delahunty, NP

## 2019-03-25 NOTE — Telephone Encounter (Signed)
I called Emma Pique, [name] daughter for scheduled palliative care f/u visit. She returned called and re-scheduled for 9:15am today

## 2019-05-01 ENCOUNTER — Other Ambulatory Visit: Payer: Medicare Other | Admitting: Nurse Practitioner

## 2019-05-01 ENCOUNTER — Other Ambulatory Visit: Payer: Self-pay

## 2019-05-01 NOTE — Progress Notes (Unsigned)
No show

## 2019-05-23 ENCOUNTER — Telehealth: Payer: Self-pay | Admitting: Nurse Practitioner

## 2019-05-23 NOTE — Telephone Encounter (Signed)
Called patient to schedule a Palliative f/u visit, no answer - left message with reason for call along with my contact information 

## 2019-06-05 ENCOUNTER — Telehealth: Payer: Self-pay | Admitting: Nurse Practitioner

## 2019-06-05 NOTE — Telephone Encounter (Signed)
Called patient to reschedule the Palliative f/u visit that was scheduled for 9/24, no answer - left message with reason for call along with my contact information.

## 2019-06-05 NOTE — Telephone Encounter (Signed)
Spoke with daughter and told her that I was calling to reschedule a Palliative f/u visit and she was in agreement with this.  I have scheduled a Telephone f/u visit for 06/23/19 @ 2 PM.

## 2019-06-23 ENCOUNTER — Encounter: Payer: Self-pay | Admitting: Nurse Practitioner

## 2019-06-23 ENCOUNTER — Other Ambulatory Visit: Payer: Medicare Other | Admitting: Nurse Practitioner

## 2019-06-23 ENCOUNTER — Other Ambulatory Visit: Payer: Self-pay

## 2019-06-23 DIAGNOSIS — Z515 Encounter for palliative care: Secondary | ICD-10-CM

## 2019-06-23 NOTE — Progress Notes (Signed)
[facility] Consult Note Telephone: [phone]  Fax: [phone]  PATIENT NAME: Emma Mejia DOB: 1946-01-07 MRN: [id number]  PRIMARY CARE PROVIDER:   Denzil Hughes, MD  REFERRING PROVIDER:  Denzil Hughes, MD [address] Medicine [address] [place],  [place] [address]  RESPONSIBLE PARTY:   Daughter Sharone Picchi [phone]  Due to the COVID-19 crisis, this visit was done via telemedicine from my office and it was initiated and consent by this patient and or family.  RECOMMENDATIONS and PLAN: 1. ACP; completed at primary and [name] will email a copy to place in [place]. MOST form per [name] to be completed by primary provider   2.Memory loss appears progressive. Medical goals to continue to focus on Comfort, redirecting with supportive measures.  3. Palliative care encounter; Palliative medicine team will continue to support patient, patient's family, and medical team. Visit consisted of counseling and education dealing with the complex and emotionally intense issues of symptom management and palliative care in the setting of serious and potentially life-threatening illness  I spent 45 minutes providing this consultation,  from 2:00pm to 2:45pm. More than 50% of the time in this consultation was spent coordinating communication.   HISTORY OF PRESENT ILLNESS:  Emma Mejia is a 73 y.o. year old female with multiple medical problems including CVA,Brain aneurysm s/p sgy, thyroid disease, hypertension, compression fracture of thoracic vertebrae kyphoplasty. Emma Mejia has been at [facility] neurology clinic 70 / 12 / 2020 by Dr Clent Jacks for MRI work-up of dementia show to new small acute infarcts in right frontal lobe and right cerebellum, concerned for embolic process. It was discussed with the [name], daughter and CTA ordered of head and neck as well as echocardiogram. She was continued on aspirin and  referral placed to Stroke Clinic. Emma Mejia continues to reside at home with her daughter [name]. Ms. Brenn is ambulatory, able to dress herself. Ms. Lueck does toilet herself although has been having episodes of incontinence recently. Emma Mejia does feed herself. I called [name] for scheduled telemedicine telephonic palliative care follow-up visit. I called [name] we talked about purpose for palliative care visit and she was in agreement. We talked about how Ms. Redmon has been doing. We talked about the recent appointment with Dr Clent Jacks with MRI results and plan for CTA. We talked about her functional-level. We talked about her cognitive disabilities. [name] and I discuss multiple examples of the challenges that she is experiencing at home Emma Mejia. [name] endorses that there are things that she is able to do on her own that she becomes very insistent that other people do the task for her. [name]'s descriptions of different scenarios appear of that misspelling jurors having difficulty with processing in recall, retaining information. An example is that if Ms. Burak is in the car she made a comment on the driver in the car next to them or the way [name] is driving yet does not notice that she has urinated on herself. We talked about different cooking strategies. We talked about her appetite that she's  more picky about food. [name] described a scenario where she brought a salad into a room with a burger. [name] have previously asked her not to put food in the trash can.  Ms. Magallon put the salad in the trash can and then tried to hide it. We talked about strategies possibly trying such as getting a whiteboard and writing information simplistically for Ms. Marsico to be  able to read such as your lunch is in the refrigerator, or when Emma Mejia is going to work each day. We talked about CTA will give more information about the brain and how it has been affected by the stroke. We talked about  medical goals. We talked about caregiver self care. We talked about role of palliative care and plan of care. We talked about the overall stressors at home. Discuss option of palliative social worker  consult for supportive role in addition to reviewing insurance for possible resources available. [name] in agreement. Will do a referral for palliative social worker consult. We discussed follow a palliative care visit in 6 weeks if needed or sooner should she decline. This will be after CTA and have more information for further discussion of medical goals of care. Scheduled appointment. Therapeutic listening and emotional support provided. Questions answered to satisfaction. Contact information provided. Palliative Care was asked to help to continue to address goals of care.   CODE STATUS: DNR  PPS: 40% HOSPICE ELIGIBILITY/DIAGNOSIS: TBD  PAST MEDICAL HISTORY:  Past Medical History:  Diagnosis Date   Brain aneurysm    Hypertension    Thyroid disease     SOCIAL HX:  Social History   Tobacco Use   Smoking status: Former Smoker   Smokeless tobacco: Never Used  Substance Use Topics   Alcohol use: Not Currently    ALLERGIES:  Allergies  Allergen Reactions   Ibuprofen    Naproxen      PERTINENT MEDICATIONS:  Outpatient Encounter Medications as of 06/23/2019  Medication Sig   acetaminophen (TYLENOL) 325 MG tablet Take 2 tablets (650 mg total) by mouth every 6 (six) hours as needed for mild pain (or Fever >/= 101). (Patient taking differently: Take 650 mg by mouth every 6 (six) hours as needed for mild pain (or Fever >/= 101). 01-06-09)   alendronate (FOSAMAX) 70 MG tablet Take 70 mg by mouth once a week. Take with a full glass of water on an empty stomach.   aspirin EC 81 MG tablet Take 81 mg by mouth daily.   calcium carbonate (OS-CAL) 600 MG TABS tablet Take 600 mg by mouth 2 (two) times daily with a meal. 9a-5p   cholecalciferol (VITAMIN D) 25 MCG (1000 UT) tablet Take 1,000  Units by mouth daily.   diclofenac sodium (VOLTAREN) 1 % GEL Apply 2 g topically 4 (four) times daily.   fluticasone (VERAMYST) 27.5 MCG/SPRAY nasal spray Place 2 sprays into the nose daily.   gabapentin (NEURONTIN) 100 MG capsule Take 100 mg by mouth 3 (three) times daily.   latanoprost (XALATAN) 0.005 % ophthalmic solution Place 1 drop into both eyes Nightly.   levothyroxine (SYNTHROID, LEVOTHROID) 75 MCG tablet Take 75 mcg by mouth daily before breakfast.   lidocaine (LIDODERM) 5 % Place 1 patch onto the skin daily. Remove & Discard patch within 12 hours or as directed by MD   lisinopril (PRINIVIL,ZESTRIL) 20 MG tablet Take 40 mg by mouth daily.   metoprolol succinate (TOPROL-XL) 100 MG 24 hr tablet Take 150 mg by mouth daily.    oxyCODONE (OXY IR/ROXICODONE) 5 MG immediate release tablet Take 1 tablet (5 mg total) by mouth every 6 (six) hours as needed for moderate pain, severe pain or breakthrough pain.   pantoprazole (PROTONIX) 20 MG tablet Take 20 mg by mouth daily.   polyethylene glycol (MIRALAX / GLYCOLAX) 17 g packet Take 17 g by mouth daily.   senna-docusate (SENOKOT-S) 8.6-50 MG tablet Take 1 tablet  by mouth at bedtime as needed for mild constipation. (Patient taking differently: Take 2 tablets by mouth at bedtime. )   No facility-administered encounter medications on file as of 06/23/2019.     PHYSICAL EXAM:   Deferred  Dedria Endres Z Ramona Slinger, NP

## 2019-06-27 ENCOUNTER — Telehealth: Payer: Self-pay

## 2019-06-30 NOTE — Telephone Encounter (Signed)
SW received referral to contact [name] (patient's daughter) to discuss resources. [name] provided brief overview of patient care and situation. Patient is currently living with [place] and her fiance in [place], [place]. [name] noted that they are paying to remodel patient's home to be more accessible and safe. [name] works night shift (11:00PM-7:00AM) and is home during the day. Patient is doing "better" and trying to do more for herself but still has limitations and relies on [name]. Discussed safety. Discussed recent medical diagnosis and impact on patient's ADLs. [name] said that neurology has found that patient had a CVA and is referring to the stroke clinic. Patient is also scheduled for a CAT scan this week per [name]. SW encouraged [name] to follow-up on referral to the stroke clinic and request an appointment. [name] said she is hopeful that they can help refer to PT/OT to continue working on patient's safety and independence. SW agreed. SW emailed information on two adult day programs in [place]. [name] appreciative of phone call and resource information. SW provided contact information for any additional questions/concerns.

## 2019-07-29 ENCOUNTER — Encounter: Payer: Self-pay | Admitting: Nurse Practitioner

## 2019-07-29 ENCOUNTER — Other Ambulatory Visit: Payer: Self-pay

## 2019-07-29 ENCOUNTER — Other Ambulatory Visit: Payer: Medicare Other | Admitting: Nurse Practitioner

## 2019-07-29 DIAGNOSIS — I639 Cerebral infarction, unspecified: Secondary | ICD-10-CM

## 2019-07-29 DIAGNOSIS — Z515 Encounter for palliative care: Secondary | ICD-10-CM

## 2019-07-29 NOTE — Progress Notes (Signed)
Therapist, nutritional Palliative Care Consult Note Telephone: [phone]  Fax: [phone]  PATIENT NAME: Emma Mejia DOB: April 12, 1946 MRN: [id number]  PRIMARY CARE PROVIDER:   Bertram Savin, MD  REFERRING PROVIDER:  Bertram Savin, MD [address] Medicine [address] [place],  [place] [address]  RESPONSIBLE PARTY:Daughter SoniaBellanger [phone]  Due to the COVID-19 crisis, this visit was done via telemedicine from my office and it was initiated and consent by this patient and or family.  RECOMMENDATIONS and PLAN: 1.ACP; DNR  2.Memory loss appears progressive. Medical goals to continue to focus on Comfort, redirecting with supportive measures.  3.Palliative care encounter; Palliative medicine team will continue to support patient, patient's family, and medical team. Visit consisted of counseling and education dealing with the complex and emotionally intense issues of symptom management and palliative care in the setting of serious and potentially life-threatening illness  I spent 35 minutes providing this consultation,  from 1:00pm to 1:35pm. More than 50% of the time in this consultation was spent coordinating communication.   HISTORY OF PRESENT ILLNESS:  Mykah Bellomo is a 73 y.o. year old female with multiple medical problems including CVA,Brain aneurysm s/p sgy, thyroid disease, hypertension, compression fracture of thoracic vertebrae kyphoplasty. I called [name], [name]'s daughter for scheduled telephonic telemedicine palliative care follow-up appointment. Talk about purpose of visit in [name] in agreement. We talked about Ms. Bellinger's doing. [name] endorses Ms. Nelson appetite has been good. No wounds falls, infections, hospitalizations. We talked about behaviors. Ms Vivero join the conference call. Ms. Checo endorses that she herself is doing well. She has no concerns or complaints. She deny  symptoms of pain or shortness of breath. Ms Farias endorses that her appetite is good. We talked about medical goals. We talked about purpose her palliative care visit. Ms Decarli does appear to be stable at present time. No current needs at present time. Discuss next palliative care visit in two months if needed or sooner should she declined. [name] and [name] both in agreement and appointment schedule. Therapeutic listening and emotional support provided. Contact information. Questions answered to satisfaction  Palliative Care was asked to help to continue to address goals of care.   CODE STATUS: DNR  PPS: 50% HOSPICE ELIGIBILITY/DIAGNOSIS: TBD  PAST MEDICAL HISTORY:  Past Medical History:  Diagnosis Date  . Brain aneurysm   . Hypertension   . Thyroid disease     SOCIAL HX:  Social History   Tobacco Use  . Smoking status: Former Games developer  . Smokeless tobacco: Never Used  Substance Use Topics  . Alcohol use: Not Currently    ALLERGIES:  Allergies  Allergen Reactions  . Ibuprofen   . Naproxen      PERTINENT MEDICATIONS:  Outpatient Encounter Medications as of 07/29/2019  Medication Sig  . acetaminophen (TYLENOL) 325 MG tablet Take 2 tablets (650 mg total) by mouth every 6 (six) hours as needed for mild pain (or Fever >/= 101). (Patient taking differently: Take 650 mg by mouth every 6 (six) hours as needed for mild pain (or Fever >/= 101). 01-06-09)  . alendronate (FOSAMAX) 70 MG tablet Take 70 mg by mouth once a week. Take with a full glass of water on an empty stomach.  Marland Kitchen aspirin EC 81 MG tablet Take 81 mg by mouth daily.  . calcium carbonate (OS-CAL) 600 MG TABS tablet Take 600 mg by mouth 2 (two) times daily with a meal. 9a-5p  . cholecalciferol (  VITAMIN D) 25 MCG (1000 UT) tablet Take 1,000 Units by mouth daily.  . diclofenac sodium (VOLTAREN) 1 % GEL Apply 2 g topically 4 (four) times daily.  . fluticasone (VERAMYST) 27.5 MCG/SPRAY nasal spray Place 2 sprays into  the nose daily.  Marland Kitchen gabapentin (NEURONTIN) 100 MG capsule Take 100 mg by mouth 3 (three) times daily.  Marland Kitchen latanoprost (XALATAN) 0.005 % ophthalmic solution Place 1 drop into both eyes Nightly.  . levothyroxine (SYNTHROID, LEVOTHROID) 75 MCG tablet Take 75 mcg by mouth daily before breakfast.  . lidocaine (LIDODERM) 5 % Place 1 patch onto the skin daily. Remove & Discard patch within 12 hours or as directed by MD  . lisinopril (PRINIVIL,ZESTRIL) 20 MG tablet Take 40 mg by mouth daily.  . metoprolol succinate (TOPROL-XL) 100 MG 24 hr tablet Take 150 mg by mouth daily.   Marland Kitchen oxyCODONE (OXY IR/ROXICODONE) 5 MG immediate release tablet Take 1 tablet (5 mg total) by mouth every 6 (six) hours as needed for moderate pain, severe pain or breakthrough pain.  . pantoprazole (PROTONIX) 20 MG tablet Take 20 mg by mouth daily.  . polyethylene glycol (MIRALAX / GLYCOLAX) 17 g packet Take 17 g by mouth daily.  Marland Kitchen senna-docusate (SENOKOT-S) 8.6-50 MG tablet Take 1 tablet by mouth at bedtime as needed for mild constipation. (Patient taking differently: Take 2 tablets by mouth at bedtime. )   No facility-administered encounter medications on file as of 07/29/2019.    PHYSICAL EXAM:   Deferred  Katasha Riga Z Angelik Walls, NP

## 2019-07-31 IMAGING — XA DG C-ARM 61-120 MIN
1 series · 1 of 1 positions shown · non-contrast
Comparison: Fluoroscopic images November 25, 2018.

CLINICAL DATA: Status post T11 kyphoplasty.

EXAM:
THORACIC SPINE 2 VIEWS; DG C-ARM 61-120 MIN
Radiation exposure index: 8.72 mGy.

[Series 4: ortho standard · 1 of 1 slices shown]
[im 1/1]
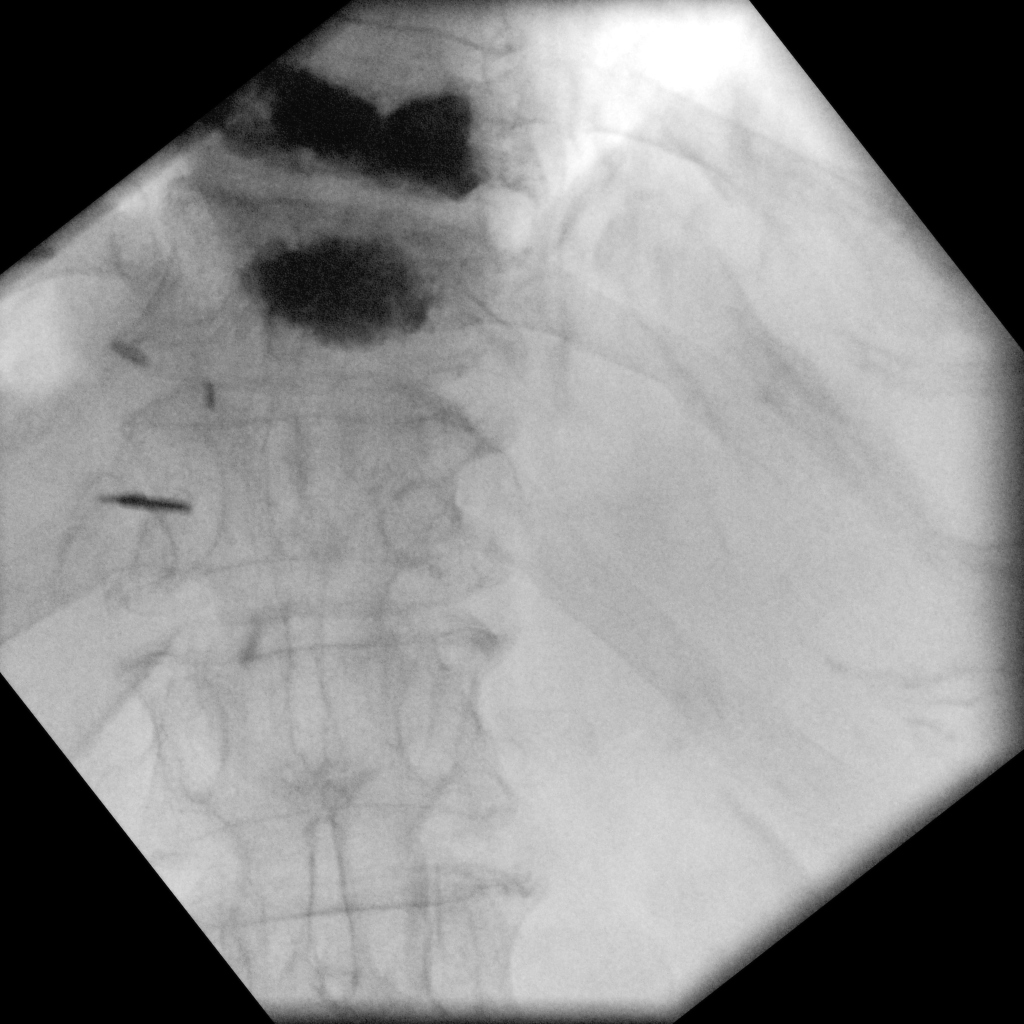

[1 of 1 positions shown; findings below may reference images not displayed]

FINDINGS: Two intraoperative fluoroscopic images of the lower thoracic spine
demonstrate the patient be status post kyphoplasty of T11. Previous
T10 kyphoplasty is noted.
IMPRESSION: Fluoroscopic guidance provided during T11 kyphoplasty.

## 2019-09-11 ENCOUNTER — Telehealth: Payer: Self-pay | Admitting: Nurse Practitioner

## 2019-09-11 ENCOUNTER — Other Ambulatory Visit: Payer: Self-pay

## 2019-09-11 ENCOUNTER — Other Ambulatory Visit: Payer: Medicare PPO | Admitting: Nurse Practitioner

## 2019-09-11 NOTE — Telephone Encounter (Signed)
Spoke with patient's daughter Emma Mejia requesting to reschedule today's Palliative f/u visit due to NP out sick, she was in agreement and this was rescheduled for 10/09/19 @ 9 AM.

## 2019-10-09 ENCOUNTER — Other Ambulatory Visit: Payer: Medicare PPO | Admitting: Nurse Practitioner

## 2019-10-09 ENCOUNTER — Other Ambulatory Visit: Payer: Self-pay

## 2019-10-09 ENCOUNTER — Telehealth: Payer: Self-pay | Admitting: Nurse Practitioner

## 2019-10-09 NOTE — Telephone Encounter (Signed)
I called for telemedicine palliative care f/u visit, no answer, unable to leave a message, will continue to try to contact

## 2019-10-17 ENCOUNTER — Telehealth: Payer: Self-pay | Admitting: Nurse Practitioner

## 2019-10-17 NOTE — Telephone Encounter (Signed)
Called patient's daughter, [name] at listed number, to reschedule the 3/4 Palliative f/u visit (no show),  rec'd message that this number was temporarily not in service.  I then called patient's son Ladene Artist, no answer - left message with reason for call along with my name and contact number.

## 2019-10-22 ENCOUNTER — Telehealth: Payer: Self-pay | Admitting: Nurse Practitioner

## 2019-10-22 NOTE — Telephone Encounter (Signed)
Called daughter's listed phone number to schedule a Palliative f/u visit, rec'd message stating that the number was temporarily out of service.  I then called son Ladene Artist, no answer - left message with my contact information.  I called listed home number in Epic for patient, no answer and unable to leave message due to mailbox was full.

## 2019-11-04 ENCOUNTER — Telehealth: Payer: Self-pay | Admitting: Nurse Practitioner

## 2019-11-04 NOTE — Telephone Encounter (Signed)
I called Derrick, Ms. Coste's son, gave number for Ms. Kuc at [employer], [place], [phone]. I called [name] who is in a meeting at work, gave me Ms. Bellenger's current phone number [phone] which I called with no answer, unable to leave a message. Will continue to try to schedule f/u palliative care visit

## 2019-11-06 ENCOUNTER — Telehealth: Payer: Self-pay | Admitting: Nurse Practitioner

## 2019-11-06 NOTE — Telephone Encounter (Signed)
I called Ms. Emma Mejia to reschedule f/u PC visit, no answer, unable to leave a message, will continue to try to contact

## 2020-02-02 ENCOUNTER — Encounter: Payer: Self-pay | Admitting: Nurse Practitioner

## 2020-02-02 ENCOUNTER — Other Ambulatory Visit: Payer: Medicare PPO | Admitting: Nurse Practitioner

## 2020-02-02 ENCOUNTER — Other Ambulatory Visit: Payer: Self-pay

## 2020-02-02 DIAGNOSIS — I639 Cerebral infarction, unspecified: Secondary | ICD-10-CM

## 2020-02-02 DIAGNOSIS — Z515 Encounter for palliative care: Secondary | ICD-10-CM

## 2020-02-02 NOTE — Progress Notes (Signed)
[facility] Consult Note Telephone: [phone]  Fax: [phone]  PATIENT NAME: Emma Mejia DOB: 1945/09/05 MRN: [id number]  PRIMARY CARE PROVIDER:   Denzil Hughes, MD  REFERRING PROVIDER:  Denzil Hughes, MD [address] [place],  [place] [address]   RESPONSIBLE PARTY:Daughter [place] [phone]  Due to the COVID-19 crisis, this visit was done via telemedicine from my office and it was initiated and consent by this patient and or family.  RECOMMENDATIONS and PLAN: 1.ACP; DNR  2.Memory loss appears progressive. Medical goals to continue to focus on Comfort, redirecting with supportive measures.  3.Palliative care encounter; Palliative medicine team will continue to support patient, patient's family, and medical team. Visit consisted of counseling and education dealing with the complex and emotionally intense issues of symptom management and palliative care in the setting of serious and potentially life-threatening illness  I spent 50 minutes providing this consultation,  from 1:00pm to 1:50pm. More than 50% of the time in this consultation was spent coordinating communication.   HISTORY OF PRESENT ILLNESS:  Emma Mejia is a 74 y.o. year old female with multiple medical problems including CVA,Brain aneurysm s/p sgy, thyroid disease, hypertension, compression fracture of thoracic vertebrae kyphoplasty.  I called Emma Mejia's residence for scheduled telemedicine telephonic is video not available follow-up palliative care visit. Emma Mejia's daughter answered the phone. We talked about purpose of palliative care visit. Emma in agreement. We talked about how Emma Mejia has been doing.  Emma Mejia continues to ambulate and perform adl's. No new changes in functional abilities. Emma Mejia has been doing much better. Emma Mejia appetite has improved. No  symptoms of pain or shortness of breath. We talked about medical goals of care. We talked about quality of life. At present time Emma Mejia does remain stable. No recent hospitalizations, wounds, falls, infections. We talked about role of palliative care and plan of care. Discuss that will follow up in two months if needed or should she declined. Emma in agreement and appointment scheduled. Therapeutic listening and emotional support provided. Contact information. Questions answered to satisfaction.  Palliative Care was asked to help to continue to address goals of care.   CODE STATUS: DNR  PPS: 50% HOSPICE ELIGIBILITY/DIAGNOSIS: TBD  PAST MEDICAL HISTORY:  Past Medical History:  Diagnosis Date  . Brain aneurysm   . Hypertension   . Thyroid disease     SOCIAL HX:  Social History   Tobacco Use  . Smoking status: Former Research scientist (life sciences)  . Smokeless tobacco: Never Used  Substance Use Topics  . Alcohol use: Not Currently    ALLERGIES:  Allergies  Allergen Reactions  . Ibuprofen   . Naproxen      PERTINENT MEDICATIONS:  Outpatient Encounter Medications as of 02/02/2020  Medication Sig  . acetaminophen (TYLENOL) 325 MG tablet Take 2 tablets (650 mg total) by mouth every 6 (six) hours as needed for mild pain (or Fever >/= 101). (Patient taking differently: Take 650 mg by mouth every 6 (six) hours as needed for mild pain (or Fever >/= 101). 01-06-09)  . alendronate (FOSAMAX) 70 MG tablet Take 70 mg by mouth once a week. Take with a full glass of water on an empty stomach.  Marland Kitchen aspirin EC 81 MG tablet Take 81 mg by mouth daily.  . calcium carbonate (OS-CAL) 600 MG TABS tablet Take 600 mg by mouth 2 (two) times daily with a meal. 9a-5p  .  cholecalciferol (VITAMIN D) 25 MCG (1000 UT) tablet Take 1,000 Units by mouth daily.  . diclofenac sodium (VOLTAREN) 1 % GEL Apply 2 g topically 4 (four) times daily.  . fluticasone (VERAMYST) 27.5 MCG/SPRAY nasal spray Place 2 sprays into the nose daily.  Marland Kitchen  gabapentin (NEURONTIN) 100 MG capsule Take 100 mg by mouth 3 (three) times daily.  Marland Kitchen latanoprost (XALATAN) 0.005 % ophthalmic solution Place 1 drop into both eyes Nightly.  . levothyroxine (SYNTHROID, LEVOTHROID) 75 MCG tablet Take 75 mcg by mouth daily before breakfast.  . lidocaine (LIDODERM) 5 % Place 1 patch onto the skin daily. Remove & Discard patch within 12 hours or as directed by MD  . lisinopril (PRINIVIL,ZESTRIL) 20 MG tablet Take 40 mg by mouth daily.  . metoprolol succinate (TOPROL-XL) 100 MG 24 hr tablet Take 150 mg by mouth daily.   Marland Kitchen oxyCODONE (OXY IR/ROXICODONE) 5 MG immediate release tablet Take 1 tablet (5 mg total) by mouth every 6 (six) hours as needed for moderate pain, severe pain or breakthrough pain.  . pantoprazole (PROTONIX) 20 MG tablet Take 20 mg by mouth daily.  . polyethylene glycol (MIRALAX / GLYCOLAX) 17 g packet Take 17 g by mouth daily.  Marland Kitchen senna-docusate (SENOKOT-S) 8.6-50 MG tablet Take 1 tablet by mouth at bedtime as needed for mild constipation. (Patient taking differently: Take 2 tablets by mouth at bedtime. )   No facility-administered encounter medications on file as of 02/02/2020.    PHYSICAL EXAM:   Deferred  Boleslaw Borghi Z Montell Leopard, NP

## 2020-03-23 ENCOUNTER — Encounter: Payer: Self-pay | Admitting: Nurse Practitioner

## 2020-03-23 ENCOUNTER — Other Ambulatory Visit: Payer: Self-pay

## 2020-03-23 ENCOUNTER — Other Ambulatory Visit: Payer: Medicare PPO | Admitting: Nurse Practitioner

## 2020-03-23 DIAGNOSIS — Z515 Encounter for palliative care: Secondary | ICD-10-CM

## 2020-03-23 DIAGNOSIS — F0280 Dementia in other diseases classified elsewhere without behavioral disturbance: Secondary | ICD-10-CM

## 2020-03-23 NOTE — Progress Notes (Addendum)
Therapist, nutritional Palliative Care Consult Note Telephone: [phone]  Fax: [phone]  PATIENT NAME: Emma Mejia DOB: 1945/08/20 MRN: [id number]  PRIMARY CARE PROVIDER:   Bertram Savin, MD  REFERRING PROVIDER:  Bertram Savin, MD [address] [place],  [place] [address]  RESPONSIBLE PARTY:Daughter Emma Mejia [phone]  Due to the COVID-19 crisis, this visit was done via telemedicine from my office and it was initiated and consent by this patient and or family.  RECOMMENDATIONS and PLAN: 1.ACP;DNR  2.Memory loss appears progressive. Medical goals to continue to focus on Comfort, redirecting with supportive measures.  3.Palliative care encounter; Palliative medicine team will continue to support patient, patient's family, and medical team. Visit consisted of counseling and education dealing with the complex and emotionally intense issues of symptom management and palliative care in the setting of serious and potentially life-threatening illness  4. F/u 4 weeks to continue to monitor appetite, decline, cognitive changes in progression of Dementia  I spent 50 minutes providing this consultation,  from 2:30pm to 3:20pm. More than 50% of the time in this consultation was spent coordinating communication.   HISTORY OF PRESENT ILLNESS:  Emma Mejia is a 74 y.o. year old female with multiple medical problems including Dementia, CVA,Brain aneurysm s/p sgy, thyroid disease, hypertension, compression fracture of thoracic vertebrae kyphoplasty.Ms Emma Mejia had a visit with [facility] ger[facility] on 8/11 / 2021 with Dr. Thomes Lolling. Slums score 11/30 (03/17/2020) previously 15/30 (April, 2021). Weight 139.3 with BMI 23. Discontinued Aricept encouraged hydration with worsening cognitive impairment as it has been more difficult getting Ms Emma Mejia to participate in hygiene and medication administration with worsening  dementia. Discontinue calcium supplement, Fosamax and decreasing pantoprazole to 10 mg to taper off. Diarrhea symptoms persistent intermittent and possibly correspond with sporadically taking her medication. Follow up palliative care visit in person scheduled though Ms Emma Mejia has moved to [facility] which is outside this this providers area and rather than waiting for the appointment to rescheduled will go ahead with telemedicine telephonic is video not available. Emma Hoard, Ms Emma Mejia and I talked about how she was feeling. We talked about recent visit at [facility]. We talked about the diarrhea which has resolved. We talked about medications and till burden. We talked about cognitive changes which seem to be worsening although Ms Emma Mejia continues to be able to stay by herself for short periods of time. Emma Mejia endorses Ms Emma Mejia is able to get herself dressed, bathe but typically in the sink. Emma Mejia endorses Ms Emma Mejia has become very resistant to the walk-in shower with a shower chair. We talked about the functional decline in the setting of chronic disease progression of dementia natural aging. We talked about different ways to get Ms Emma Mejia to take a shower. We talked about realistic expectations with progression. No recent falls as she is ambulatory in her home. Emma Mejia endorses they have completed the renovations to make it handicap friendly for Ms Emma Mejia. Emma Mejia endorses that family has arranged schedules to stay with Ms Emma Mejia during the day and at night time. We talked about appetite and nutrition. We talked about different options for food including dairy, supplements caution with cheese due to constipation. We talked about [place] yogurt. We talked about option of dietitian consult. Emma Mejia endorses they do have in Endocrinology appointment tomorrow and will bring up request for a dietitian console. We talked about having to increase her calcium in daily foods and what that would look like. We talked  about the team approach at [facility]. We talked about medical goals of care.  Wishes are to treat what is treatable. We talked about different aspects of care in the future that Ms Emma Mejia may require and financially what that could look like. We talked about further discussion looking into Medicaid although at this time Emma Mejia endorses she does not seem to be eligible. Emma Mejia endorses she will follow up with Social Security office just to see what the financial requirements are and if there is a spend-down what that would consist of. We talked at length about role of palliative care in plan of care. Discuss with Emma Mejia will have palliative nurse practitioner contact to schedule follow-up in-person visit in around for weeks. [name] in agreement. Therapeutic listening, emotional support provided. Contact information. Questions answered to satisfaction.  Palliative Care was asked to help to continue to address goals of care.   CODE STATUS: DNR  PPS: 50% HOSPICE ELIGIBILITY/DIAGNOSIS: TBD  PAST MEDICAL HISTORY:  Past Medical History:  Diagnosis Date  . Brain aneurysm   . Hypertension   . Thyroid disease     SOCIAL HX:  Social History   Tobacco Use  . Smoking status: Former Games developer  . Smokeless tobacco: Never Used  Substance Use Topics  . Alcohol use: Not Currently    ALLERGIES:  Allergies  Allergen Reactions  . Ibuprofen   . Naproxen      PERTINENT MEDICATIONS:  Outpatient Encounter Medications as of 03/23/2020  Medication Sig  . acetaminophen (TYLENOL) 325 MG tablet Take 2 tablets (650 mg total) by mouth every 6 (six) hours as needed for mild pain (or Fever >/= 101). (Patient taking differently: Take 650 mg by mouth every 6 (six) hours as needed for mild pain (or Fever >/= 101). 01-06-09)  . alendronate (FOSAMAX) 70 MG tablet Take 70 mg by mouth once a week. Take with a full glass of water on an empty stomach.  Marland Kitchen aspirin EC 81 MG tablet Take 81 mg by mouth daily.  . calcium  carbonate (OS-CAL) 600 MG TABS tablet Take 600 mg by mouth 2 (two) times daily with a meal. 9a-5p  . cholecalciferol (VITAMIN D) 25 MCG (1000 UT) tablet Take 1,000 Units by mouth daily.  . diclofenac sodium (VOLTAREN) 1 % GEL Apply 2 g topically 4 (four) times daily.  . fluticasone (VERAMYST) 27.5 MCG/SPRAY nasal spray Place 2 sprays into the nose daily.  Marland Kitchen gabapentin (NEURONTIN) 100 MG capsule Take 100 mg by mouth 3 (three) times daily.  Marland Kitchen latanoprost (XALATAN) 0.005 % ophthalmic solution Place 1 drop into both eyes Nightly.  . levothyroxine (SYNTHROID, LEVOTHROID) 75 MCG tablet Take 75 mcg by mouth daily before breakfast.  . lidocaine (LIDODERM) 5 % Place 1 patch onto the skin daily. Remove & Discard patch within 12 hours or as directed by MD  . lisinopril (PRINIVIL,ZESTRIL) 20 MG tablet Take 40 mg by mouth daily.  . metoprolol succinate (TOPROL-XL) 100 MG 24 hr tablet Take 150 mg by mouth daily.   Marland Kitchen oxyCODONE (OXY IR/ROXICODONE) 5 MG immediate release tablet Take 1 tablet (5 mg total) by mouth every 6 (six) hours as needed for moderate pain, severe pain or breakthrough pain.  . pantoprazole (PROTONIX) 20 MG tablet Take 20 mg by mouth daily.  . polyethylene glycol (MIRALAX / GLYCOLAX) 17 g packet Take 17 g by mouth daily.  Marland Kitchen senna-docusate (SENOKOT-S) 8.6-50 MG tablet Take 1 tablet by mouth at bedtime as needed for mild constipation. (Patient taking differently:  Take 2 tablets by mouth at bedtime. )   No facility-administered encounter medications on file as of 03/23/2020.    PHYSICAL EXAM:   Deferred  Zoya Sprecher Z Sindy Mccune, NP

## 2020-05-03 ENCOUNTER — Telehealth: Payer: Self-pay | Admitting: Primary Care

## 2020-05-03 NOTE — Telephone Encounter (Signed)
T/c to [employer] to set up a time to do home visit. Message left.

## 2020-05-10 ENCOUNTER — Other Ambulatory Visit: Payer: Medicare PPO | Admitting: Primary Care

## 2020-05-10 ENCOUNTER — Other Ambulatory Visit: Payer: Self-pay

## 2020-05-10 DIAGNOSIS — R531 Weakness: Secondary | ICD-10-CM

## 2020-05-10 DIAGNOSIS — F0280 Dementia in other diseases classified elsewhere without behavioral disturbance: Secondary | ICD-10-CM

## 2020-05-10 DIAGNOSIS — Z515 Encounter for palliative care: Secondary | ICD-10-CM

## 2020-05-10 NOTE — Progress Notes (Signed)
Designer, jewellery Palliative Care Consult Note Telephone: [phone]  Fax: [phone]  PATIENT NAME: Emma Mejia [address] [place] [address] [phone] (home)  DOB: November 12, 1945 MRN: [id number]  PRIMARY CARE PROVIDER:    Denzil Hughes, MD,  [address] [place] [address] [place] [address] [phone]  REFERRING PROVIDER:   Denzil Hughes, MD [address] [place] [address] [place],  [place] [address] [phone]  RESPONSIBLE PARTY:   Extended Emergency Contact Information Primary Emergency Contact: Mejia, Emma Phone: [phone] Mobile Phone: [phone] Relation: Son Secondary Emergency Contact: Emma, Mejia Mobile Phone: [phone] Relation: Daughter Preferred language: English  I met face to face with patient in home.  ASSESSMENT AND RECOMMENDATIONS:   1. Advance Care Planning/Goals of Care: Goals include to maximize quality of life and symptom management. Our advance care planning conversation included a discussion about:     Exploration of goals of care in the event of a sudden injury or illness   Identification of a healthcare agent -Daughter  Review of an  advance directive document .   Reviewed MOST with daughter, does have it at her home. Will bring it to pt home. Has DNR on record.  2. Symptom Management:   Pain: Endorses left upper breast pain, associates with covid injection. This is ongoing for some months. Last mammorgram reported 2 years ago; Recommend mammogram.  Safety: States she is stable in the yard, and does not wander. Denies use of ambulatory assistive  Device. Denies falls.  Nutrition: Encouraged to get mocha flavored protein drinks, she loves to drink coffees. Daughter reports she drinks coffee to exclude food. The protein/meal supplement drinks in coffee flavor may meet both these needs.    Dementia/ Day program: Some documented decline in SLUMS over several months in previous notes. Discussed getting caregiver for mid day for cueing for meds, food and to help with hygiene.  Discussed day program for social stimulation. Referred to [facility] program in [place].   3. Follow up Palliative Care Visit: Palliative care will continue to follow for goals of care clarification and symptom management. Return 12 weeks or prn.  4. Family /Caregiver/Community Supports: Daughter and son are POA. Living in own home, has cameras to monitor. Referred to some resources in community for assistance.  5. Cognitive / Functional decline: A and O x 1, Forgetful, Able to do most adls but requires cueing. Dependent in iadls.   I spent 60 minutes providing this consultation,  from 1230 to 1330. More than 50% of the time in this consultation was spent coordinating communication.   CHIEF COMPLAINT: dementia, fall risk  HISTORY OF PRESENT ILLNESS:  Emma Mejia is a 74 y.o. year old female with multiple medical problems including fall risk, osteoporosis, dementia, old cva, protein calorie malnutrition. Palliative Care was asked to follow this patient by consultation request of Feltner, Doy Mince, MD to help address advance care planning and goals of care. This is a follow up visit.  CODE STATUS: DNR per earlier notation  PPS: 50%  HOSPICE ELIGIBILITY/DIAGNOSIS: no  PHYSICAL EXAM / ROS:   Current and past weights: 139 lbs, BMI 23.2., 142 lbs per report. General: NAD, frail appearing, thin Cardiovascular: no chest pain reported, no  LE edema  Pulmonary: no cough, no increased SOB, room air Abdomen: appetite fair, denies constipation, continent of bowel GU: denies dysuria, continent of urine  MSK:  +  joint and ROM abnormalities, ambulatory in home, no falls recently Skin: no rashes or wounds reported Neurological: Weakness, Pain in upper outer left breast, SLUMS decreased from 15 to  11/30 in 4 months.  CURRENT PROBLEM LIST:  Patient Active Problem List   Diagnosis Date Noted  . Other chronic pain 02/18/2019  . Generalized weakness 02/18/2019  . Palliative care encounter 02/18/2019  . Vertebral compression fracture ([place]) 11/23/2018  . Compression fracture of body of thoracic vertebra ([facility]) 07/07/2018   PAST MEDICAL HISTORY:  Active Ambulatory Problems    Diagnosis Date Noted  . Compression fracture of body of thoracic vertebra ([place]) 07/07/2018  . Vertebral compression fracture ([place]) 11/23/2018  . Other chronic pain 02/18/2019  . Generalized weakness 02/18/2019  . Palliative care encounter 02/18/2019  . Age-related nuclear cataract of both eyes 01/18/2018  . Cognitive impairment 07/08/2013  . CVA, old, cognitive deficits 07/08/2013  . Dry eye syndrome of bilateral lacrimal glands 01/18/2018  . 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 Hypertension 07/08/2013  . 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 Hypothyroidism 07/08/2013  . Iron deficiency 12/06/2016  . Osteoarthritis 02/18/2016  . Osteoporosis 02/18/2016  . Recurrent major depressive disorder ([place]) 01/12/2017  . Sensorineural hearing loss (SNHL) of both ears 10/07/2018   Resolved Ambulatory Problems    Diagnosis Date Noted  . No Resolved Ambulatory Problems   Past Medical History:  Diagnosis Date  . Brain aneurysm   . Thyroid disease      SOCIAL HX:  Social History   Tobacco Use  . Smoking status: Former Research scientist (life sciences)  . Smokeless tobacco: Never Used  Substance Use Topics  . Alcohol use: Not Currently   FAMILY HX: No family history on file.  ALLERGIES:  Allergies  Allergen Reactions  . Ibuprofen   . Naproxen      PERTINENT MEDICATIONS:  Outpatient Encounter Medications as of 05/10/2020  Medication Sig  . acetaminophen (TYLENOL) 325 MG tablet Take 2 tablets (650 mg total) by mouth every 6 (six) hours as needed for mild pain (or Fever >/= 101). (Patient  taking differently: Take 650 mg by mouth every 6 (six) hours as needed for mild pain (or Fever >/= 101). 01-06-09)  . aspirin 81 MG chewable tablet Chew 81 mg by mouth daily.  . calcium carbonate (OS-CAL) 600 MG TABS tablet Take 600 mg by mouth 2 (two) times daily with a meal. 9a-5p  . cholecalciferol (VITAMIN D) 25 MCG (1000 UT) tablet Take 1,000 Units by mouth daily.  . citalopram (CELEXA) 20 MG tablet Take 20 mg by mouth daily.  . diclofenac sodium (VOLTAREN) 1 % GEL Apply 2 g topically 4 (four) times daily.  Marland Kitchen gabapentin (NEURONTIN) 100 MG capsule Take 200 mg by mouth at bedtime.   Marland Kitchen latanoprost (XALATAN) 0.005 % ophthalmic solution Place 1 drop into both eyes Nightly.  . levothyroxine (SYNTHROID, LEVOTHROID) 75 MCG tablet Take 75 mcg by mouth daily before breakfast.  . lisinopril (PRINIVIL,ZESTRIL) 20 MG tablet Take 40 mg by mouth daily.  . metoprolol succinate (TOPROL-XL) 100 MG 24 hr tablet Take 200 mg by mouth daily.   . rosuvastatin (CRESTOR) 40 MG tablet Take 40 mg by mouth daily.  . [DISCONTINUED] aspirin EC 81 MG tablet Take 81 mg by mouth daily.  . fluticasone (VERAMYST) 27.5 MCG/SPRAY nasal spray Place 2 sprays into the nose daily. (Patient not taking: Reported on 05/10/2020)  . [DISCONTINUED] alendronate (FOSAMAX) 70 MG tablet Take 70 mg by mouth once a week. Take  with a full glass of water on an empty stomach.  . [DISCONTINUED] lidocaine (LIDODERM) 5 % Place 1 patch onto the skin daily. Remove & Discard patch within 12 hours or as directed by MD  . [DISCONTINUED] oxyCODONE (OXY IR/ROXICODONE) 5 MG immediate release tablet Take 1 tablet (5 mg total) by mouth every 6 (six) hours as needed for moderate pain, severe pain or breakthrough pain.  . [DISCONTINUED] pantoprazole (PROTONIX) 20 MG tablet Take 20 mg by mouth daily.  . [DISCONTINUED] polyethylene glycol (MIRALAX / GLYCOLAX) 17 g packet Take 17 g by mouth daily.  . [DISCONTINUED] senna-docusate (SENOKOT-S) 8.6-50 MG tablet Take 1  tablet by mouth at bedtime as needed for mild constipation. (Patient taking differently: Take 2 tablets by mouth at bedtime. )   No facility-administered encounter medications on file as of 05/10/2020.      Jason Coop, NP , DNP, MPH, [facility]  COVID-19 PATIENT SCREENING TOOL  Person answering questions: ____________self______ _____   1.  Is the patient or any family member in the home showing any signs or symptoms regarding respiratory infection?               Person with Symptom- __________NA_________________  a. Fever                                                                          Yes___ No___          ___________________  b. Shortness of breath                                                    Yes___ No___          ___________________ c. Cough/congestion                                       Yes___  No___         ___________________ d. Body aches/pains                                                         Yes___ No___        ____________________ e. Gastrointestinal symptoms (diarrhea, nausea)           Yes___ No___        ____________________  2. Within the past 14 days, has anyone living in the home had any contact with someone with or under investigation for COVID-19?    Yes___ No_X_   Person __________________

## 2020-08-04 ENCOUNTER — Telehealth: Payer: Self-pay | Admitting: Primary Care

## 2020-08-04 NOTE — Telephone Encounter (Signed)
Connected with daughter [name] but then lost connection, called back to schedule appt for routine f/u. Message left.

## 2020-08-05 ENCOUNTER — Other Ambulatory Visit: Payer: Self-pay

## 2020-08-05 ENCOUNTER — Other Ambulatory Visit: Payer: Medicare PPO | Admitting: Primary Care

## 2020-08-05 DIAGNOSIS — Z515 Encounter for palliative care: Secondary | ICD-10-CM

## 2020-08-05 DIAGNOSIS — F0280 Dementia in other diseases classified elsewhere without behavioral disturbance: Secondary | ICD-10-CM

## 2020-08-05 DIAGNOSIS — I422 Other hypertrophic cardiomyopathy: Secondary | ICD-10-CM

## 2020-08-05 NOTE — Progress Notes (Signed)
[facility] Palliative Care Consult Note Telephone: [phone]  Fax: [phone]     Date of encounter: 08/05/20 PATIENT NAME: Emma Mejia [address] [place] [address] [phone] (home)  DOB: 1946-06-20 MRN: [id number]  PRIMARY CARE PROVIDER:    Denzil Hughes, MD,  [address] [place] [address] [place] [address] [phone]  REFERRING PROVIDER:   Denzil Hughes, MD [address] [place] [address] [place],  [place] [address] [phone]  RESPONSIBLE PARTY:   Extended Emergency Contact Information Primary Emergency Contact: Swisher, [place] Phone: [phone] Mobile Phone: [phone] Relation: Son Secondary Emergency Contact: Chiniqua, [place] Mobile Phone: [phone] Relation: Daughter Preferred language: English  I met face to face with patient in  Home. Palliative Care was asked to follow this patient by consultation request of Feltner, Doy Mince, MD to help address advance care planning and goals of care. This is a follow up  visit.   ASSESSMENT AND RECOMMENDATIONS:   1. Advance Care Planning/Goals of Care: Goals include to maximize quality of life and symptom management.Talked to [place], daughter, who states MOST form is not in home and she cannot locate.  I will post new one to them and they will complete. She did not have any questions as she had had a conference with a case Freight forwarder. We discussed the extent of interventions. She, patient and pts son will discuss.    2. Symptom Management:   Meds reviewed with daughter by phone.  Also discussed pt has not had glaucoma meds x 1 year. Recommended to have eye appt asap.  Recent PT for upper arm discomfort. Daughter reports PCP r/o breast issues. Daughter is looking at PT at [facility]. They may also benefit from home exercise program doing chair exercises, or going to senior center for group activities.  Patient endorses eating well. Weight is stable/ slightly  increased. She appears WNWD. Encouraged to drink water as she likes caffeinated coffee.  3. Follow up Palliative Care Visit: Palliative care will continue to follow for goals of care clarification and symptom management. Return 8-12 weeks or prn.  4. Family /Caregiver/Community Supports: Daughter is POA, lives alone in own home with family support  5. Cognitive / Functional decline: a and o x 2, forgetful. Able to do some adls, dependent in iadls.  I spent 40 minutes providing this consultation,  from 1530 to 1610. More than 50% of the time in this consultation was spent coordinating communication.   CODE STATUS:FULL  PPS: 50%  HOSPICE ELIGIBILITY/DIAGNOSIS: TBD  Subjective:  CHIEF COMPLAINT: dementia  HISTORY OF PRESENT ILLNESS:  Emma Mejia is a 74 y.o. year old female  with dementia, osteoporosis. Has had nutritional decline which appears improved. Denies falls or injuries.   We are asked to consult around advance care planning and complex medical decision making.   History obtained from review of EMR, discussion with primary team, and  interview with family, caregiver  and/or Ms. Ishler. Records reviewed and summarized above.   CURRENT PROBLEM LIST:  Patient Active Problem List   Diagnosis Date Noted  . Other chronic pain 02/18/2019  . Generalized weakness 02/18/2019  . Palliative care encounter 02/18/2019  . Vertebral compression fracture ([place]) 11/23/2018  . 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 Compression fracture of body of thoracic vertebra ([place]) 07/07/2018  . Age-related nuclear cataract of both eyes 01/18/2018  . Dry eye syndrome of bilateral lacrimal glands 01/18/2018  . 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  Recurrent major depressive disorder ([place]) 01/12/2017  . Iron deficiency 12/06/2016  . Osteoarthritis 02/18/2016  . Osteoporosis 02/18/2016  . Cognitive impairment 07/08/2013  . 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 Hypertension 07/08/2013  . Hypothyroidism 07/08/2013  . 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 Compression fracture of body of thoracic vertebra ([place]) 07/07/2018  . Vertebral compression fracture ([place]) 11/23/2018  . Other chronic pain 02/18/2019  . Generalized weakness 02/18/2019  . Palliative care encounter 02/18/2019  . Age-related nuclear cataract of both eyes 01/18/2018  . Cognitive impairment 07/08/2013  . CVA, old, cognitive deficits 07/08/2013  . Dry eye syndrome of bilateral lacrimal glands 01/18/2018  . 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 Hypertension 07/08/2013  . 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 Hypothyroidism 07/08/2013  . Iron deficiency 12/06/2016  . Osteoarthritis 02/18/2016  . Osteoporosis 02/18/2016  . Recurrent major depressive disorder ([place]) 01/12/2017  . Sensorineural hearing loss (SNHL) of both ears 10/07/2018   Resolved Ambulatory Problems    Diagnosis Date Noted  . No Resolved Ambulatory Problems   Past Medical History:  Diagnosis Date  . Brain aneurysm   . Thyroid disease    SOCIAL HX:  Social History   Tobacco Use  . Smoking status: Former Research scientist (life sciences)  . Smokeless tobacco: Never Used  Substance Use Topics  . Alcohol use: Not Currently   FAMILY HX: No family history on file.     ALLERGIES:  Allergies  Allergen Reactions  . Ibuprofen   . Naproxen      PERTINENT MEDICATIONS:  Outpatient Encounter Medications as of 08/05/2020  Medication Sig  . acetaminophen (TYLENOL) 650 MG CR tablet Take 1,300 mg by mouth every 12 (twelve) hours.  Marland Kitchen aspirin 81 MG chewable tablet Chew 81 mg by mouth daily.  . calcium carbonate (OS-CAL) 600 MG TABS tablet Take 600 mg by mouth 2 (two) times daily with a meal. 9a-5p  . citalopram (CELEXA) 20 MG tablet Take 20 mg by mouth daily.  . diclofenac sodium (VOLTAREN)  1 % GEL Apply 2 g topically 4 (four) times daily.  Marland Kitchen gabapentin (NEURONTIN) 100 MG capsule Take 200 mg by mouth at bedtime.   Marland Kitchen levothyroxine (SYNTHROID, LEVOTHROID) 75 MCG tablet Take 75 mcg by mouth daily before breakfast.  . lisinopril (PRINIVIL,ZESTRIL) 20 MG tablet Take 40 mg by mouth daily.  . metoprolol succinate (TOPROL-XL) 100 MG 24 hr tablet Take 200 mg by mouth daily.   . rosuvastatin (CRESTOR) 40 MG tablet Take 40 mg by mouth daily.  . [DISCONTINUED] acetaminophen (TYLENOL) 325 MG tablet Take 2 tablets (650 mg total) by mouth every 6 (six) hours as needed for mild pain (or Fever >/= 101). (Patient taking differently: Take 650 mg by mouth every 6 (six) hours as needed for mild pain (or Fever >/= 101). 01-06-09)  . cholecalciferol (VITAMIN D) 25 MCG (1000 UT) tablet Take 1,000 Units by mouth daily. (Patient not taking: Reported on 08/05/2020)  . fluticasone (VERAMYST) 27.5 MCG/SPRAY nasal spray Place 2 sprays into the nose daily. (Patient not taking: Reported on 08/05/2020)  . latanoprost (XALATAN) 0.005 % ophthalmic solution Place 1 drop into both eyes Nightly. (Patient not taking: Reported on 08/05/2020)   No facility-administered encounter medications on file as of 08/05/2020.    Objective: ROS  General: NAD EYES: denies vision changes, wears glasses, no eye exam in > 12 months ENMT: denies  dysphagia Cardiovascular: denies chest pain Pulmonary: denies  cough, denies increased SOB Abdomen: endorses good appetite, denies constipation, endorses continence of bowel GU: denies dysuria, endorses continence of urine MSK:  endorses ROM limitations, no falls reported Skin: denies rashes or wounds Neurological: endorses weakness, endorses pain, denies insomnia Psych: Endorses positive mood Heme/lymph/immuno: denies bruises, abnormal bleeding  Physical Exam: Current and past weights: 143 lbs,  BMI =25.44 Constitutional: NAD General: frail appearing, WNWD EYES: anicteric sclera,lids  intact, no discharge  ENMT: intact hearing,oral mucous membranes moist, dentition intact CV:  no LE edema Pulmonary:  no increased work of breathing, no cough, no audible wheezes, room air Abdomen: intake 75%, no ascites GU: deferred MSK: mod sarcopenia, decreased ROM in all extremities, no contractures of LE, ambulatory (I) Skin: warm and dry, no rashes or wounds on visible skin Neuro: Generalized weakness, moderate cognitive impairment Psych: non-anxious affect, A and O x 2 Hem/lymph/immuno: no widespread bruising   Thank you for the opportunity to participate in the care of Ms. Limburg.  The palliative care team will continue to follow. Please call our office at [phone] if we can be of additional assistance.  Jason Coop, NP , DNP, MPH, AGPCNP-BC, ACHPN  COVID-19 PATIENT SCREENING TOOL  Person answering questions: ____________self______ _____   1.  Is the patient or any family member in the home showing any signs or symptoms regarding respiratory infection?               Person with Symptom- __________NA_________________  a. Fever                                                                          Yes___ No___          ___________________  b. Shortness of breath                                                    Yes___ No___          ___________________ c. Cough/congestion                                       Yes___  No___         ___________________ d. Body aches/pains                                                         Yes___ No___        ____________________ e. Gastrointestinal symptoms (diarrhea, nausea)           Yes___ No___        ____________________  2. Within the past 14 days, has anyone living in the home had any contact with someone with or under investigation for COVID-19?    Yes___ No_X_   Person __________________

## 2020-10-25 ENCOUNTER — Other Ambulatory Visit: Payer: Self-pay

## 2020-10-25 ENCOUNTER — Other Ambulatory Visit: Payer: Medicare PPO | Admitting: Primary Care

## 2020-10-25 DIAGNOSIS — Z515 Encounter for palliative care: Secondary | ICD-10-CM

## 2020-10-25 DIAGNOSIS — R531 Weakness: Secondary | ICD-10-CM

## 2020-10-25 DIAGNOSIS — F0280 Dementia in other diseases classified elsewhere without behavioral disturbance: Secondary | ICD-10-CM

## 2020-10-25 DIAGNOSIS — I639 Cerebral infarction, unspecified: Secondary | ICD-10-CM

## 2020-10-25 NOTE — Progress Notes (Signed)
[facility] Pall[facility] Consult Note Telephone: [phone]  Fax: [phone]    Date of encounter: 10/25/20 PATIENT NAME: Emma Mejia [address] [place] [address] [phone] (home)  DOB: [birth year] MRN: [id number]  PRIMARY CARE PROVIDER:    Denzil Hughes, MD,  [address] [place] [address] [place] [address] [phone]  REFERRING PROVIDER:   Denzil Hughes, MD [address] [place] [address] [place],  [place] [address] [phone]  RESPONSIBLE PARTY:   Contact Information    Name Relation Home Work [address]   Emma Mejia [phone]  [phone]   Emma Mejia Daughter   [phone]       I met face to face with patient in home. Palliative Care was asked to follow this patient by consultation request of  Feltner, Doy Mince, MD to address advance care planning and complex medical decision making. This is a follow up visit.     ASSESSMENT AND RECOMMENDATIONS:   1. Advance Care Planning/Goals of Care: Goals include to maximize quality of life and symptom management. Not on file, will resend in [place] mail to daughter c/o patient.  2. Symptom Management:   I met with patient at her home. She was watching television. Today is a visit to f/u on hospital trip a month ago and to assess current status.  She states her grandson lives with her off and on but generally she's by herself, with her daughter checking in. She appears comfortable and denies any complaints. She states her weight has been stable at  low 120s. She does not remember her hospitalization in February. She is currently having therapy services through home health. Patient appears stable while ambulating. She's not using an assistive device.   Advance care planning documents not able to be found. I will mail to home again.  3. Follow up Palliative Care Visit: Palliative care will continue to follow for goals of care clarification and symptom  management. Return 8-12 weeks or prn.  4. Family /Caregiver/Community Supports:  Son and daughter are POA, lives in own home  5. Cognitive / Functional decline: Very forgetful, A and O x 1, able to do some adls, dependent in all iadls.  I spent 25 minutes providing this consultation,  from 1615 to 1640.  More than 50% of the time in this consultation was spent in counseling and care coordination.  CODE STATUS: FULL  PPS: 50%  HOSPICE ELIGIBILITY/DIAGNOSIS: TBD  CHIEF COMPLAINT: dementia  HISTORY OF PRESENT ILLNESS:  Emma Mejia is a 75 y.o. year old female  with dementia with weakness, s/p cva, memory loss, fall risk. Today she presents with good mobility. This is a visit to follow up from hospital trip and return home. She is supported by family in home. ACP has not been completed  We are asked to consult around advance care planning and complex medical decision making.    Review and summarization of old Epic records shows or history from other than patient, including family members.  Review or lab tests albumin 4.1  History obtained from review of EMR, discussion with primary team, and  interview with family, caregiver  and/or Emma Mejia. Records reviewed and summarized above.   CURRENT PROBLEM LIST:  Patient Active Problem List   Diagnosis Date Noted  . Other chronic pain 02/18/2019  . Generalized weakness 02/18/2019  . Palliative care encounter 02/18/2019  . Vertebral compression fracture ([place]) 11/23/2018  . 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  Compression fracture of body of thoracic vertebra ([place]) 07/07/2018  . Age-related nuclear cataract of both eyes 01/18/2018  . Dry eye syndrome of bilateral lacrimal glands 01/18/2018  . 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 Recurrent major depressive disorder ([place]) 01/12/2017  . Iron deficiency 12/06/2016  . Osteoarthritis 02/18/2016  . Osteoporosis 02/18/2016  . Cognitive impairment  07/08/2013  . CVA, old, cognitive deficits 07/08/2013  . Hypertension 07/08/2013  . Hypothyroidism 07/08/2013  . 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 Compression fracture of body of thoracic vertebra ([place]) 07/07/2018  . Vertebral compression fracture ([place]) 11/23/2018  . Other chronic pain 02/18/2019  . Generalized weakness 02/18/2019  . Palliative care encounter 02/18/2019  . Age-related nuclear cataract of both eyes 01/18/2018  . Cognitive impairment 07/08/2013  . CVA, old, cognitive deficits 07/08/2013  . Dry eye syndrome of bilateral lacrimal glands 01/18/2018  . 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 Hypertension 07/08/2013  . 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 Hypothyroidism 07/08/2013  . Iron deficiency 12/06/2016  . Osteoarthritis 02/18/2016  . Osteoporosis 02/18/2016  . Recurrent major depressive disorder ([place]) 01/12/2017  . Sensorineural hearing loss (SNHL) of both ears 10/07/2018   Resolved Ambulatory Problems    Diagnosis Date Noted  . No Resolved Ambulatory Problems   Past Medical History:  Diagnosis Date  . Brain aneurysm   . Thyroid disease    SOCIAL HX:  Social History   Tobacco Use  . Smoking status: Former Research scientist (life sciences)  . Smokeless tobacco: Never Used  Substance Use Topics  . Alcohol use: Not Currently   FAMILY HX: No family history on file.    ALLERGIES:  Allergies  Allergen Reactions  . Ibuprofen   . Naproxen      PERTINENT MEDICATIONS:  Outpatient Encounter Medications as of 10/25/2020  Medication Sig  . acetaminophen (TYLENOL) 650 MG CR tablet Take 1,300 mg by mouth every 12 (twelve) hours.  Marland Kitchen aspirin 81 MG chewable tablet Chew 81 mg by mouth daily.  . calcium carbonate (OS-CAL) 600 MG TABS tablet Take 600 mg by mouth 2 (two) times daily with a meal. 9a-5p  . cholecalciferol (VITAMIN D) 25 MCG (1000  UT) tablet Take 1,000 Units by mouth daily. (Patient not taking: Reported on 08/05/2020)  . citalopram (CELEXA) 20 MG tablet Take 20 mg by mouth daily.  . diclofenac sodium (VOLTAREN) 1 % GEL Apply 2 g topically 4 (four) times daily.  . fluticasone (VERAMYST) 27.5 MCG/SPRAY nasal spray Place 2 sprays into the nose daily. (Patient not taking: Reported on 08/05/2020)  . gabapentin (NEURONTIN) 100 MG capsule Take 200 mg by mouth at bedtime.   Marland Kitchen latanoprost (XALATAN) 0.005 % ophthalmic solution Place 1 drop into both eyes Nightly. (Patient not taking: Reported on 08/05/2020)  . levothyroxine (SYNTHROID, LEVOTHROID) 75 MCG tablet Take 75 mcg by mouth daily before breakfast.  . lisinopril (PRINIVIL,ZESTRIL) 20 MG tablet Take 40 mg by mouth daily.  . metoprolol succinate (TOPROL-XL) 100 MG 24 hr tablet Take 200 mg by mouth daily.   . rosuvastatin (CRESTOR) 40 MG tablet Take 40 mg by mouth daily.   No facility-administered encounter medications on file as of 10/25/2020.     ROS   General: NAD ENMT: denies dysphagia Cardiovascular: denies chest pain Pulmonary: denies  cough, denies increased SOB Abdomen: endorses good appetite, denies constipation, endorses continence of bowel GU: denies dysuria, endorses continence of  urine MSK:  endorses ROM limitations, no falls reported in past month Skin: denies rashes or wounds Neurological: endorses weakness, denies pain, denies insomnia Psych: Endorses positive mood Heme/lymph/immuno: denies bruises, abnormal bleeding  Physical Exam: Current and past weights: 154 lb per hospital record Constitutional:NAD General: frail appearing, WNWD  CV:  no LE edema Pulmonary:  no increased work of breathing, no cough, no audible wheezes, room air Abdomen: intake 100 %, no ascites MSK: decreased ROM in all extremities,ambulatory Skin: warm and dry, no rashes or wounds on visible skin Neuro: Generalized weakness, severe cognitive impairment Psych: non-anxious  affect, A and O x 1-2 Hem/lymph/immuno: no widespread bruising  Thank you for the opportunity to participate in the care of Ms. Stempel.  The palliative care team will continue to follow. Please call our office at [phone] if we can be of additional assistance.   Jason Coop, NP , DNP, MPH, AGPCNP-BC, ACHPN   COVID-19 PATIENT SCREENING TOOL  Person answering questions: _______self____________   1.  Is the patient or any family member in the home showing any signs or symptoms regarding respiratory infection?                  Person with Symptom  ______________na___________ a. Fever/chills/headache                                                        Yes___ No__X_            b. Shortness of breath                                                            Yes___ No__X_           c. Cough/congestion                                               Yes___  No__X_          d. Muscle/Body aches/pains                                                   Yes___ No__X_         e. Gastrointestinal symptoms (diarrhea,nausea)             Yes___ No__X_         f. Sudden loss of smell or taste      Yes___ No__X_        2. Within the past 10 days, has anyone living in the home had any contact with someone with or under investigation for COVID-19?    Yes___ No__X__   Person __________________

## 2021-08-05 ENCOUNTER — Telehealth: Payer: Self-pay | Admitting: Primary Care

## 2021-08-05 NOTE — Telephone Encounter (Signed)
T/c to offer home palliative care visit. No answer, message left.

## 2021-08-24 ENCOUNTER — Telehealth: Payer: Self-pay | Admitting: Primary Care

## 2021-08-24 NOTE — Telephone Encounter (Signed)
Correction:  The appointment was scheduled for 09/01/21 @ 9:30 AM

## 2021-08-24 NOTE — Telephone Encounter (Signed)
Spoke with patient's daughter [name] and have scheduled a Palliative f/u visit for 08/25/21 @ 9:30 AM.  Daughter stated that she would let patient know that NP would be coming out to see her.

## 2021-08-25 ENCOUNTER — Other Ambulatory Visit: Payer: Medicare PPO | Admitting: Primary Care

## 2021-09-01 ENCOUNTER — Other Ambulatory Visit: Payer: Medicare PPO | Admitting: Primary Care

## 2021-09-01 ENCOUNTER — Other Ambulatory Visit: Payer: Self-pay

## 2021-09-01 VITALS — BP 148/83 | HR 65 | Resp 18 | Ht 65.0 in | Wt 142.0 lb

## 2021-09-01 DIAGNOSIS — Z515 Encounter for palliative care: Secondary | ICD-10-CM

## 2021-09-01 DIAGNOSIS — F0280 Dementia in other diseases classified elsewhere without behavioral disturbance: Secondary | ICD-10-CM

## 2021-09-01 DIAGNOSIS — I422 Other hypertrophic cardiomyopathy: Secondary | ICD-10-CM

## 2021-09-01 NOTE — Progress Notes (Signed)
Therapist, nutritional Palliative Care Consult Note Telephone: [phone]  Fax: [phone]    Date of encounter: 09/01/21 10:13 AM PATIENT NAME: Emma Mejia [address] [place] [address] [place] [address]   [phone] (home)  DOB: 1946/01/14 MRN: [id number] PRIMARY CARE PROVIDER:    Bertram Savin, MD,  [address] [place] [address] [place] [address] [phone]  REFERRING PROVIDER:   Bertram Savin, MD [address] [place] [address] [place],  [place] [address] [phone]  RESPONSIBLE PARTY:    Contact Information     Name Relation Home Work [address]   Emma, Mejia [phone]  [phone]   Emma, Mejia Daughter   [phone]        I met face to face with patient in the home. Palliative Care was asked to follow this patient by consultation request of  Feltner, Clayton Bibles, MD to address advance care planning and complex medical decision making. This is a follow up visit.                                   ASSESSMENT AND PLAN / RECOMMENDATIONS:   Advance Care Planning/Goals of Care: Goals include to maximize quality of life and symptom management. Patient/health care surrogate gave his/her permission to discuss.Our advance care planning conversation included a discussion about:    The value and importance of advance care planning  Experiences with loved ones who have been seriously ill or have died  Exploration of personal, cultural or spiritual beliefs that might influence medical decisions  Exploration of goals of care in the event of a sudden injury or illness  Identification of a healthcare agent - son and daugher Review of an advance directive document - Daughter still has it at her home. Discussed choices with  daughter who will get out MOST which she has at home and bring to her mom's.  CODE STATUS: FULL  Symptom Management/Plan:  Nutrition: Enjoys a lot of coffee, wt is 142 lbs.Emma Mejia WNWD.   Cardiac: Bp slightly  elevated, but has not had day medications yet. L>R LE edema, 1+ and slight.  Hydration: Encouraged to drink, she does drink coffee but has some other water based drinks as well.  Safety: Pt fell 6 months ago, now has a code for key box. She will go on porch sometimes but has not gone further. Family member next door passed several weeks ago. Had fallen 6 months ago.  Caregivers: Does not qualify for medicaid, has a friend who comes in to assist with bathing. Education RE the [facility] day program for dementia, in [place]. Daughter to pursue as an option for care and stimulation. Discussed future care needs and plans for care as dementia progresses.  Follow up Palliative Care Visit: Palliative care will continue to follow for complex medical decision making, advance care planning, and clarification of goals. Return  4 months or prn.  I spent 60 minutes providing this consultation. More than 50% of the time in this consultation was spent in counseling and care coordination.  PPS: 50%  HOSPICE ELIGIBILITY/DIAGNOSIS: no Chief Complaint: dementia, self care deficits  HISTORY OF PRESENT ILLNESS:  Emma Mejia is a 76 y.o. year old female  with CAD, dementia, self care deficits.   History obtained from review of EMR, discussion with primary team, and interview with family, facility staff/caregiver and/or Emma Mejia.  I reviewed available labs, medications, imaging, studies and  related documents from the EMR.  Records reviewed and summarized above.   ROS   General: NAD ENMT: denies dysphagia Pulmonary: denies cough, denies increased SOB Abdomen: endorses good appetite, denies constipation, endorses continence of bowel GU: denies dysuria, endorses continence of urine MSK:  denies  increased weakness,  no recent falls reported (1 6 months ago) Skin: denies rashes or wounds Neurological: denies pain, denies insomnia Psych: Endorses positive mood, advanced short term memory loss   Heme/lymph/immuno: denies bruises, abnormal bleeding  Physical Exam: Today's Vitals   09/01/21 1020  BP: (!) 148/83  Pulse: 65  Resp: 18  Weight: 142 lb (64.4 kg)  Height: $Remove'5\' 5"'IGnaqDs$  (1.651 m)   Body mass index is 23.63 kg/m.  General: frail appearing EYES: anicteric sclera, lids intact, no discharge  ENMT: intact hearing, oral mucous membranes moist CV: S1S2, 3/6 murmur RRR, 1+ LE edema Pulmonary: LCTA, no increased work of breathing, no cough, room air Abdomen: intake 75-100%, normo-active BS + 4 quadrants, soft and non tender, no ascites GU: deferred MSK: + sarcopenia, moves all extremities, ambulatory with walker  Skin: warm and dry, no rashes or wounds on visible skin Neuro:  no new  generalized weakness,  advanced cognitive impairment Psych: non-anxious affect, A and O x 1 Hem/lymph/immuno: no widespread bruising   Thank you for the opportunity to participate in the care of Emma Mejia.  The palliative care team will continue to follow. Please call our office at [phone] if we can be of additional assistance.   Jason Coop, NP DNP, AGPCNP-BC  COVID-19 PATIENT SCREENING TOOL Asked and negative response unless otherwise noted:   Have you had symptoms of covid, tested positive or been in contact with someone with symptoms/positive test in the past 5-10 days?

## 2021-11-23 ENCOUNTER — Other Ambulatory Visit: Payer: Medicare PPO | Admitting: Primary Care

## 2021-11-23 DIAGNOSIS — Z515 Encounter for palliative care: Secondary | ICD-10-CM

## 2021-11-23 DIAGNOSIS — R531 Weakness: Secondary | ICD-10-CM

## 2021-11-23 DIAGNOSIS — I639 Cerebral infarction, unspecified: Secondary | ICD-10-CM

## 2021-11-23 DIAGNOSIS — F0280 Dementia in other diseases classified elsewhere without behavioral disturbance: Secondary | ICD-10-CM

## 2021-11-23 NOTE — Progress Notes (Signed)
? ? ?[facility] ?Community Palliative Care Consult Note ?Telephone: [phone]  ?Fax: [phone]  ? ? ?Date of encounter: 11/23/21 ?9:58 AM ?PATIENT NAME: Emma Mejia ?Emma Mejia   ?[phone] (home)  ?DOB: June 22, 1946 ?MRN: [id number] ?PRIMARY CARE PROVIDER:    ?Emma Panning, MD,  ?No address on file ?None ? ?REFERRING PROVIDER:   ?Emma Panning, MD ?No address on file ?N/A ? ?RESPONSIBLE PARTY:    ?Contact Information   ? ? Name Relation Home Work Mobile  ? Emma Mejia, Emma Mejia [phone]  [phone]  ? Emma Mejia, Emma Mejia Daughter   [phone]  ? ?  ? ? ?I met face to face with patient and family in  home. Palliative Care was asked to follow this patient by consultation request of  Emma Mejia, Emma Nickels, MD to address advance care planning and complex medical decision making. This is a follow up visit. ? ?                                 ASSESSMENT AND PLAN / RECOMMENDATIONS:  ? ?Advance Care Planning/Goals of Care: Goals include to maximize quality of life and symptom management. Patient/health care surrogate gave his/her permission to discuss.Our advance care planning conversation included a discussion about:    ?The value and importance of advance care planning  ?Experiences with loved ones who have been seriously ill or have died  ?Exploration of personal, cultural or spiritual beliefs that might influence medical decisions  ?Exploration of goals of care in the event of a sudden injury or illness  ?Identification of a healthcare agent - Emma Mejia and Emma Mejia, children ?Review f an  advance directive document  ?MOST form left, reviewed thoroughly RE QoL, ACP. Left 5 wishes and other ACP literature. ?Emma Mejia wishes to speak with her brother.. Will complete next visit. ?CODE STATUS: FuLL ? ?I spent 20 minutes providing this consultation. More than 50% of the time in this consultation was spent in counseling and care  coordination. ? ?--------------------------------------------------------------------------------------------------------------------------------------------- ?Symptom Management/Plan: ? ?Dementia: Progressing. Now forgetting to take prompted medications. Family has alexa with ability to call and cue for adls, meds, etc. Today on my arrival she'd slept thru her med alarm and daughter was facetiming to cue her.  ? ?Caregiver: Daughter endorses needing to find a hired care giver to come and help with bathing adls. ? ?[place]opement [place]. Pt endorses she does not like to go out. Door is alarmed by camera and she is getting a door code device.  ? ?Nutrition: Changed to decaf, drinks a lot of coffee. Has ensure as well for supplement. ? ?Follow up Palliative Care Visit: Palliative care will continue to follow for complex medical decision making, advance care planning, and clarification of goals. Return 6-8 weeks or prn. ? ?This visit was coded based on medical decision making (MDM). ? ?PPS: 40% ? ?HOSPICE ELIGIBILITY/DIAGNOSIS: TBD ? ?Chief Complaint: dementia ? ?HISTORY OF PRESENT ILLNESS:  Emma Mejia is a 76 y.o. year old female  with dementia,  . Patient seen today to review palliative care needs to include medical decision making and advance care planning as appropriate.  ? ?History obtained from review of EMR, discussion with primary team, and interview with family, facility staff/caregiver and/or Emma Mejia.  ?I reviewed available labs, medications, imaging, studies and related documents from the EMR.  Records reviewed and summarized above.  ? ?  ROS ? ?General: NAD ?EYES: denies vision changes ?ENMT: denies dysphagia ?Cardiovascular: denies chest pain, denies DOE ?Pulmonary: denies cough, denies increased SOB ?Abdomen: endorses good appetite, denies constipation, endorses continence of bowel ?GU: denies dysuria, endorses continence of urine ?MSK:  denies  increased  weakness,  no falls reported ?Skin: denies rashes or wounds ?Neurological: denies pain, denies insomnia ?Psych: Endorses positive mood ? ?Physical Exam: ?Current and past weights: ?Constitutional: 154/89 hr 58 rr 18 ?General: frail appearing, thin ?EYES: anicteric sclera, lids intact, no discharge  ?ENMT: intact hearing, oral mucous membranes moist, dentition intact ?CV: S1S2, RRR, 4/6 murmur,  no LE edema ?Pulmonary: LCTA, no increased work of breathing, no cough, room air ?Abdomen: intake 70%, normo-active BS + 4 quadrants, soft and non tender, no ascites ?MSK: mild  sarcopenia, moves all extremities, ambulatory with walker ?Skin: warm and dry, no rashes or wounds on visible skin ?Neuro:  no generalized weakness,  moderately severe cognitive impairment, non-anxious affect ? ? ?Thank you for the opportunity to participate in the care of Emma Mejia.  The palliative care team will continue to follow. Please call our office at [phone] if we can be of additional assistance.  ? ?Jason Coop, NP DNP, AGPCNP-BC ? ?COVID-19 PATIENT SCREENING TOOL ?Asked and negative response unless otherwise noted:  ? ?Have you had symptoms of covid, tested positive or been in contact with someone with symptoms/positive test in the past 5-10 days?  ? ?

## 2021-12-28 ENCOUNTER — Other Ambulatory Visit: Payer: Medicare PPO | Admitting: Primary Care

## 2021-12-28 DIAGNOSIS — Z515 Encounter for palliative care: Secondary | ICD-10-CM

## 2021-12-28 NOTE — Progress Notes (Signed)
    [facility] Consult Note Telephone: [phone]  Fax: [phone]    Date of encounter: 12/28/21 PATIENT NAME: Emma Mejia  [address]   [phone] (home)  DOB: Jan 15, 1946 MRN: [id number] PRIMARY CARE PROVIDER:    Junie Panning, MD,  No address on file None  REFERRING PROVIDER:   Harder, Newman Nickels, MD No address on file N/A  RESPONSIBLE PARTY:    Contact Information     Name Relation Home Work [address]   [place], Losoya [id number]  [place] Daughter   [phone]         Palliative Care was asked to follow this patient by consultation request of  Harder, Newman Nickels, MD to address advance care planning.                                   ASSESSMENT AND PLAN / RECOMMENDATIONS:   Advance Care Planning/Goals of Care: Goals include to maximize quality of life and symptom management. Patient/health care surrogate gave his/her permission to discuss.Our advance care planning conversation included a discussion about:    The value and importance of advance care planning  Exploration of personal, cultural or spiritual beliefs that might influence medical decisions Identification and preparation of a healthcare agent - sonia and her brother Review of an  advance directive document . [name] forgot about our meeting today. She would like to reschedule when her brother can be there. CODE STATUS: full   Follow up Palliative Care Visit: Palliative care will continue to follow for complex medical decision making, advance care planning, and clarification of goals. Return 6-8 weeks or prn. Family to call to make appt when convenient.  I spent 10 minutes providing this consultation. More than 50% of the time in this consultation was spent in counseling and care coordination.  Thank you for the opportunity to participate in the care of Ms. Schipani.  The palliative care team will  continue to follow. Please call our office at [phone] if we can be of additional assistance.   Jason Coop, NP ,   COVID-19 PATIENT SCREENING TOOL Asked and negative response unless otherwise noted:   Have you had symptoms of covid, tested positive or been in contact with someone with symptoms/positive test in the past 5-10 days?

## 2022-03-09 ENCOUNTER — Telehealth: Payer: Self-pay

## 2022-03-09 NOTE — Telephone Encounter (Addendum)
PC SW outreached patient/family, son Ladene Artist,  to complete telephonic check in and schedule in person PC visit.  Call unsuccessful. SW LVM with contact information.

## 2022-03-09 NOTE — Telephone Encounter (Signed)
PC SW outreached patient/family, daughter [name], to complete telephonic check in and schedule in person PC visit.  Call unsuccessful. SW LVM with contact information.

## 2022-04-14 ENCOUNTER — Telehealth: Payer: Self-pay | Admitting: Primary Care

## 2022-04-14 NOTE — Telephone Encounter (Signed)
Palliative Medicine patient in [facility] hospital. Due to d/c tomorrow to SNF. Will follow. Daughter called to advise.

## 2022-05-05 ENCOUNTER — Non-Acute Institutional Stay: Payer: Medicare PPO | Admitting: Primary Care

## 2022-05-05 DIAGNOSIS — R531 Weakness: Secondary | ICD-10-CM

## 2022-05-05 DIAGNOSIS — Z515 Encounter for palliative care: Secondary | ICD-10-CM

## 2022-05-05 DIAGNOSIS — I639 Cerebral infarction, unspecified: Secondary | ICD-10-CM

## 2022-05-05 NOTE — Progress Notes (Signed)
[facility] Consult Note Telephone: [phone]  Fax: [phone]    Date of encounter: 05/05/22 1:08 PM PATIENT NAME: Emma Mejia [place] [address]   [phone] (home)  DOB: 01/04/1946 MRN: [id number] PRIMARY CARE PROVIDER:    Junie Panning, MD,  No address on file None  REFERRING PROVIDER:   Liz Malady NP Emma Mejia   RESPONSIBLE PARTY:    Contact Information     Name Relation Home Work [address]   Emma Mejia, Emma Mejia [phone]  [phone]   Emma Mejia, Emma Mejia Daughter   [phone]        I met face to face with patient in [facility]. Palliative Care was asked to follow this patient by consultation request of  Emma Malady NP to address advance care planning and complex medical decision making. This is a follow up visit.                                   ASSESSMENT AND PLAN / RECOMMENDATIONS:   Advance Care Planning/Goals of Care: Goals include to maximize quality of life and symptom management.  CODE STATUS: FULL Attempt to call Daughter for discussion, tried 2 numbers, No ability to leave message.  Symptom Management/Plan:  Family had called and asked for me following at SNF. She is sitting in chair and at lunch. Does not remember me from home. She endorses being  Able to walk but walker is not nearby. Recommend having walker within reach.  For d/c home soon. Recommend home health, robust in home care.  Follow up Palliative Care Visit: PC home program will be d/c, will be happy to follow if she remains at the SNF.  I spent 15 minutes providing this consultation. More than 50% of the time in this consultation was spent in counseling and care coordination.   PPS: 40%  HOSPICE ELIGIBILITY/DIAGNOSIS: TBD  Chief Complaint: debility, dementia  HISTORY OF PRESENT ILLNESS:  Emma Mejia is a 76 y.o. year old female  with recent cva, dementia . Patient seen today to review  palliative care needs to include medical decision making and advance care planning as appropriate.   History obtained from review of EMR, discussion with primary team, and interview with family, facility staff/caregiver and/or Emma Mejia.  I reviewed available labs, medications, imaging, studies and related documents from the EMR.  Records reviewed and summarized above.   ROS   General: NAD EYES: denies vision changes ENMT: denies dysphagia Cardiovascular: denies chest pain, denies DOE Pulmonary: denies cough, denies increased SOB Abdomen: endorses good appetite, denies constipation, endorses continence of bowel GU: denies dysuria, endorses continence of urine MSK:  denies  increased weakness,  no falls reported Skin: denies rashes or wounds Neurological: denies pain, denies insomnia Psych: Endorses positive mood  Physical Exam: Current and past weights: 124 lbs Constitutional: NAD General: frail appearing, wnwd EYES: anicteric sclera, lids intact, no discharge , R eye with new protrusion, facility LPN and NP notified ENMT: intact hearing, oral mucous membranes moist, dentition intact CV: RRR, no LE edema Pulmonary:no increased work of breathing, no cough, room air Abdomen: intake 70%, soft and non tender, slight  ascites MSK: + sarcopenia, moves all extremities, ambulatory per report, walker in room Skin: warm and dry, no rashes or wounds on visible skin Neuro:  + generalized weakness,  severe  cognitive impairment, non-anxious affect   Thank you for the opportunity  to participate in the care of Emma Mejia.  The palliative care team will continue to follow. Please call our office at [phone] if we can be of additional assistance.   Emma Coop, NP DNP, AGPCNP-BC  COVID-19 PATIENT SCREENING TOOL Asked and negative response unless otherwise noted:   Have you had symptoms of covid, tested positive or been in contact with someone with symptoms/positive test  in the past 5-10 days?

## 2022-05-22 ENCOUNTER — Other Ambulatory Visit: Payer: Medicare PPO | Admitting: Primary Care

## 2022-05-22 DIAGNOSIS — F0280 Dementia in other diseases classified elsewhere without behavioral disturbance: Secondary | ICD-10-CM

## 2022-05-22 DIAGNOSIS — Z515 Encounter for palliative care: Secondary | ICD-10-CM

## 2022-05-22 DIAGNOSIS — I639 Cerebral infarction, unspecified: Secondary | ICD-10-CM

## 2022-05-22 DIAGNOSIS — R531 Weakness: Secondary | ICD-10-CM

## 2022-05-22 NOTE — Progress Notes (Signed)
Therapist, nutritional Palliative Care Consult Note Telephone: [phone]  Fax: [phone]    Date of encounter: 05/22/22 1:17 PM PATIENT NAME: Emma Mejia [address] [place] [address] [place] [address]   [phone] (home)  DOB: 04-01-1946 MRN: [id number] PRIMARY CARE PROVIDER:    Doren Custard, MD,  No address on file None  REFERRING PROVIDER:   Harder, Christy Gentles, MD No address on file N/A  RESPONSIBLE PARTY:    Contact Information     Name Relation Home Work [address]   Emma, Mejia [phone]  [phone]   Emma, Mejia Daughter   [phone]        I met face to face with patient and family in  home. Palliative Care was asked to follow this patient by consultation request of  Harder, Christy Gentles, MD to address advance care planning and complex medical decision making. This is a follow up visit.                                   ASSESSMENT AND PLAN / RECOMMENDATIONS:   Advance Care Planning/Goals of Care: Goals include to maximize quality of life and symptom management. Patient/health care surrogate gave his/her permission to discuss. Our advance care planning conversation included a discussion about:    The value and importance of advance care planning  Exploration of personal, cultural or spiritual beliefs that might influence medical decisions  Exploration of goals of care in the event of a sudden injury or illness  Identification of a healthcare agent - both children Review of an  advance directive document . Daughter voices her brother completed full scope and she does not agree.  She would like to have a zoom meeting with the 3 of [place] to discuss. Blank MoST left for their review. CODE STATUS: FULL  Symptom Management/Plan:  Intake: Very good, eats all meals. Drinking enough water.  Caregiver strain: Has day time caregiver now since cva. A family member divides her time between patient and another family  member  Mobility: Has PT, OT from [facility]. Working on Field seismologist, HEP. Has DME in home and bath.  Mentation; Mood flatter since recent cva. Does not interject verbally in conversation. No agitation. Discussed activities to help with memory eg music, puzzles, word games.  Follow up Palliative Care Visit: Palliative care will continue to follow for complex medical decision making, advance care planning, and clarification of goals. Return 6 weeks or prn.  I spent 40 minutes providing this consultation. More than 50% of the time in this consultation was spent in counseling and care coordination.  PPS: 40%  HOSPICE ELIGIBILITY/DIAGNOSIS: TBD  Chief Complaint: recent CVA  HISTORY OF PRESENT ILLNESS:  Emma Mejia is a 76 y.o. year old female  with cva, DM, debility .   History obtained from review of EMR, discussion with primary team, and interview with family, facility staff/caregiver and/or Emma Mejia.  I reviewed available labs, medications, imaging, studies and related documents from the EMR.  Records reviewed and summarized above.   ROS   General: NAD ENMT: denies dysphagia Pulmonary: denies cough, denies increased SOB Abdomen: endorses good appetite, denies constipation, endorses continence of bowel GU: denies dysuria, endorses continence of urine MSK:  denies increased weakness,  no falls reported Skin: denies rashes or wounds Neurological: denies pain, denies insomnia Psych: Endorses positive mood, less verbal than prior to CVA Heme/lymph/immuno: denies bruises, abnormal bleeding  Physical  Exam: Current and past weights:unavailable Constitutional: NAD General: frail appearing, thin EYES: anicteric sclera, lids intact, no discharge  ENMT: intact hearing, oral mucous membranes moist, dentition intact CV: no LE edema Pulmonary:  no increased work of breathing, no cough, room air Abdomen: intake 100%, no ascites GU: deferred MSK: mod  sarcopenia, moves all extremities,  ambulatory with cane Skin: warm and dry, no rashes or wounds on visible skin Neuro:  +generalized weakness,  moderately severe  cognitive impairment Psych: slight anxious affect, A and O x 2 Hem/lymph/immuno: no widespread bruising   Thank you for the opportunity to participate in the care of Emma Mejia.  The palliative care team will continue to follow. Please call our office at [phone] if we can be of additional assistance.   Jason Coop DNP, MPH, AGPCNP-BC, ACHPN   COVID-19 PATIENT SCREENING TOOL Asked and negative response unless otherwise noted:   Have you had symptoms of covid, tested positive or been in contact with someone with symptoms/positive test in the past 5-10 days?

## 2023-03-19 ENCOUNTER — Encounter: Payer: Self-pay | Admitting: Nurse Practitioner
